# Patient Record
Sex: Female | Born: 1980 | Race: White | Hispanic: No | Marital: Married | Smoking: Never smoker
Health system: Southern US, Community
[De-identification: ages and names within clinical notes are randomized; demographics above are authoritative.]

## PROBLEM LIST (undated history)

## (undated) ENCOUNTER — Inpatient Hospital Stay (HOSPITAL_COMMUNITY): Payer: Self-pay

## (undated) DIAGNOSIS — R112 Nausea with vomiting, unspecified: Secondary | ICD-10-CM

## (undated) DIAGNOSIS — L989 Disorder of the skin and subcutaneous tissue, unspecified: Secondary | ICD-10-CM

## (undated) DIAGNOSIS — Z9889 Other specified postprocedural states: Secondary | ICD-10-CM

## (undated) DIAGNOSIS — Z98891 History of uterine scar from previous surgery: Secondary | ICD-10-CM

## (undated) DIAGNOSIS — Z8751 Personal history of pre-term labor: Secondary | ICD-10-CM

## (undated) DIAGNOSIS — O24410 Gestational diabetes mellitus in pregnancy, diet controlled: Secondary | ICD-10-CM

## (undated) DIAGNOSIS — O24419 Gestational diabetes mellitus in pregnancy, unspecified control: Secondary | ICD-10-CM

## (undated) DIAGNOSIS — O26899 Other specified pregnancy related conditions, unspecified trimester: Secondary | ICD-10-CM

## (undated) DIAGNOSIS — Z6791 Unspecified blood type, Rh negative: Secondary | ICD-10-CM

## (undated) HISTORY — DX: Nausea with vomiting, unspecified: Z98.890

## (undated) HISTORY — DX: Gestational diabetes mellitus in pregnancy, diet controlled: O24.410

## (undated) HISTORY — DX: Personal history of pre-term labor: Z87.51

## (undated) HISTORY — DX: Gestational diabetes mellitus in pregnancy, unspecified control: O24.419

## (undated) HISTORY — DX: Unspecified blood type, rh negative: Z67.91

## (undated) HISTORY — DX: Other specified postprocedural states: R11.2

## (undated) HISTORY — DX: Disorder of the skin and subcutaneous tissue, unspecified: L98.9

## (undated) HISTORY — DX: Other specified pregnancy related conditions, unspecified trimester: O26.899

## (undated) HISTORY — PX: WISDOM TOOTH EXTRACTION: SHX21

## (undated) HISTORY — DX: History of uterine scar from previous surgery: Z98.891

---

## 2006-02-24 ENCOUNTER — Ambulatory Visit: Payer: Self-pay | Admitting: Family Medicine

## 2006-02-24 ENCOUNTER — Encounter: Payer: Self-pay | Admitting: Family Medicine

## 2006-02-25 ENCOUNTER — Ambulatory Visit: Payer: Self-pay | Admitting: Obstetrics & Gynecology

## 2007-02-28 ENCOUNTER — Encounter: Payer: Self-pay | Admitting: Family Medicine

## 2007-02-28 ENCOUNTER — Ambulatory Visit: Payer: Self-pay | Admitting: Family Medicine

## 2007-08-17 ENCOUNTER — Ambulatory Visit: Payer: Self-pay | Admitting: Obstetrics & Gynecology

## 2007-08-24 ENCOUNTER — Inpatient Hospital Stay (HOSPITAL_COMMUNITY): Admission: AD | Admit: 2007-08-24 | Discharge: 2007-08-24 | Payer: Self-pay | Admitting: Obstetrics and Gynecology

## 2007-09-01 ENCOUNTER — Inpatient Hospital Stay (HOSPITAL_COMMUNITY): Admission: AD | Admit: 2007-09-01 | Discharge: 2007-09-01 | Payer: Self-pay | Admitting: Obstetrics and Gynecology

## 2007-09-09 ENCOUNTER — Inpatient Hospital Stay (HOSPITAL_COMMUNITY): Admission: AD | Admit: 2007-09-09 | Discharge: 2007-09-09 | Payer: Self-pay | Admitting: Obstetrics and Gynecology

## 2007-09-10 ENCOUNTER — Inpatient Hospital Stay (HOSPITAL_COMMUNITY): Admission: AD | Admit: 2007-09-10 | Discharge: 2007-09-10 | Payer: Self-pay | Admitting: Family Medicine

## 2007-09-19 ENCOUNTER — Encounter: Payer: Self-pay | Admitting: Obstetrics & Gynecology

## 2007-09-19 ENCOUNTER — Ambulatory Visit: Payer: Self-pay | Admitting: Obstetrics & Gynecology

## 2007-10-17 ENCOUNTER — Ambulatory Visit: Payer: Self-pay | Admitting: Family Medicine

## 2007-11-14 ENCOUNTER — Ambulatory Visit: Payer: Self-pay | Admitting: Gynecology

## 2007-11-28 ENCOUNTER — Ambulatory Visit (HOSPITAL_COMMUNITY): Admission: RE | Admit: 2007-11-28 | Discharge: 2007-11-28 | Payer: Self-pay | Admitting: Gynecology

## 2007-12-12 ENCOUNTER — Ambulatory Visit: Payer: Self-pay | Admitting: Family Medicine

## 2007-12-28 ENCOUNTER — Ambulatory Visit (HOSPITAL_COMMUNITY): Admission: RE | Admit: 2007-12-28 | Discharge: 2007-12-28 | Payer: Self-pay | Admitting: Gynecology

## 2008-01-01 ENCOUNTER — Ambulatory Visit: Payer: Self-pay | Admitting: Gynecology

## 2008-01-16 ENCOUNTER — Ambulatory Visit: Payer: Self-pay | Admitting: Family Medicine

## 2008-01-31 ENCOUNTER — Ambulatory Visit: Payer: Self-pay | Admitting: Gynecology

## 2008-02-01 ENCOUNTER — Ambulatory Visit (HOSPITAL_COMMUNITY): Admission: RE | Admit: 2008-02-01 | Discharge: 2008-02-01 | Payer: Self-pay | Admitting: Gynecology

## 2008-02-06 ENCOUNTER — Ambulatory Visit: Payer: Self-pay | Admitting: Obstetrics & Gynecology

## 2008-02-06 ENCOUNTER — Inpatient Hospital Stay (HOSPITAL_COMMUNITY): Admission: AD | Admit: 2008-02-06 | Discharge: 2008-02-09 | Payer: Self-pay | Admitting: Obstetrics & Gynecology

## 2008-02-06 ENCOUNTER — Encounter: Payer: Self-pay | Admitting: Obstetrics & Gynecology

## 2008-02-10 ENCOUNTER — Encounter: Admission: RE | Admit: 2008-02-10 | Discharge: 2008-03-11 | Payer: Self-pay | Admitting: Obstetrics & Gynecology

## 2008-02-15 ENCOUNTER — Ambulatory Visit: Payer: Self-pay | Admitting: Gynecology

## 2008-03-12 ENCOUNTER — Encounter: Admission: RE | Admit: 2008-03-12 | Discharge: 2008-04-11 | Payer: Self-pay | Admitting: Obstetrics & Gynecology

## 2008-03-14 ENCOUNTER — Ambulatory Visit: Payer: Self-pay | Admitting: Obstetrics & Gynecology

## 2008-04-12 ENCOUNTER — Encounter: Admission: RE | Admit: 2008-04-12 | Discharge: 2008-05-07 | Payer: Self-pay | Admitting: Obstetrics & Gynecology

## 2008-10-23 ENCOUNTER — Ambulatory Visit: Payer: Self-pay | Admitting: Obstetrics & Gynecology

## 2008-10-23 ENCOUNTER — Encounter: Payer: Self-pay | Admitting: Obstetrics & Gynecology

## 2010-03-11 ENCOUNTER — Ambulatory Visit: Payer: Self-pay | Admitting: Obstetrics and Gynecology

## 2010-12-08 NOTE — Assessment & Plan Note (Signed)
NAME:  Alexandra Douglas, Alexandra Douglas NO.:  [id number]   MEDICAL RECORD NO.:  [id number]          PATIENT TYPE:  POB   LOCATION:  [facility] at [facility]         FACILITY:  [facility]   PHYSICIAN:  Ginger Carne, MD DATE OF BIRTH:  March 06, 1981   DATE OF SERVICE:  02/15/2008                                  CLINIC NOTE   [name] returns today for followup after a primary low-transverse  cesarean section for preterm delivery of twins on February 06, 2008.  Her  incision had a subcuticular stitch placed and is well healed, dry, and  clean without evidence of cellulitis.  The patient has information about  an IUD and birth control options.  Her bleeding is scant to light.  The  patient is breast-feeding and she reports the babies are doing well.  She will return in 3-4 weeks for her postpartum visit.           ______________________________  Ginger Carne, MD     SHB/MEDQ  D:  02/15/2008  T:  02/16/2008  Job:  [id number]

## 2010-12-08 NOTE — Op Note (Signed)
NAME:  Alexandra Douglas, Alexandra Douglas NO.:  [id number]   MEDICAL RECORD NO.:  [id number]          PATIENT TYPE:  INP   LOCATION:                                FACILITY:  [facility]   PHYSICIAN:  Alexandra Bossier, MD        DATE OF BIRTH:  03-Sep-1980   DATE OF PROCEDURE:  02/06/2008  DATE OF DISCHARGE:  02/09/2008                               OPERATIVE REPORT   PREOPERATIVE DIAGNOSES:  1. Intrauterine pregnancy at 51 weeks' and 0 days' dichorionic      diamniotic twin gestation.  2. Labor.  3. Preterm premature rupture of membranes.  4. Group B Streptococcus, unknown.   POSTOPERATIVE DIAGNOSES:  1. Intrauterine pregnancy at 34 weeks' and 0 days' dichorionic      diamniotic twin gestation.  2. Labor.  3. Preterm premature rupture of membranes.  4. Group B Streptococcus, unknown.   PROCEDURE:  Primary low-transverse cesarean section.   SURGEON:  Alexandra Bossier, MD   ASSISTANT:  Alexandra Lemon, MD   ANESTHESIA:  Spinal.   FINDINGS:  1. Baby A is a viable infant female, weight pending with Apgars 7 at 1      minute and 9 at 5 minutes, in vertex presentation.  2. Baby B is a viable infant female, weight pending at time of      dictation, Apgars 6 at 1 minute and 9 at 5 minutes, in frank breech      presentation.  3. Clear amniotic fluid from gestational sac of twin B.  4. Normal female pelvic anatomy.   ESTIMATED BLOOD LOSS:  800 mL.   DRAINS:  Foley with clear yellow urine.   COMPLICATIONS:  None immediate.   SPECIMENS:  1. Placenta for twin A and B to pathology.  2. Cord blood for A and B to the lab.   INDICATIONS FOR PROCEDURE:  Alexandra Douglas is a 30 year old gravida 1,  para 0, at 22 weeks' and 0 days' gestation presenting with dichorionic  diamniotic twins.  She presented to [facility] with rupture of  membranes of twin A.  At time of admission, her cervix was closed  visually on speculum examination.  She was admitted and started on  magnesium sulfate for 12 hours  for cerebral palsy risk reduction,  betamethasone for fetal lung maturity, and ampicillin and erythromycin  for latency after rupture of membranes.  She remained stable most of  hospital day #1 but around 7:30 p.m. on January 07, 2008, the patient  started having painful uterine contractions.  She was reevaluated with  speculum examination, and cervix was visualized to be 4 cm open with  twin A's head visualized.  At this time, the patient was taken for  primary low-transverse cesarean section.   DESCRIPTION OF PROCEDURE:  The patient was taken to the operating room  and after obtaining adequate spinal anesthesia was prepped and draped in  the usual sterile manner in the supine position with left lateral  uterine displacement.  After ensuring adequate anesthesia, Pfannenstiel  skin incision was made using the scalpel.  Incision was carried down  through the subcutaneous tissues using the scalpel.  The rectus fascia  was nicked in the midline and the incision was extended laterally in  each direction using the Mayo scissors.  The rectus muscles were  dissected free of the fascia using both sharp and blunt dissection.  Rectus muscles were then separated bluntly.  Parietal peritoneum was  identified, grasped between the hemostats, elevated, and entered under  direct visualization.  Bladder blade was placed, and the lower uterine  segment was evaluated and found to be of adequate size for the low-  transverse uterine incision.  A low-transverse uterine incision was made  using the scalpel, and the incision was extended laterally and  superiorly using bandage scissors.  Hand was placed within the uterine  cavity and used to elevate and flex the head of infant A, which was  delivered without difficulty.  The infant was bulb suctioned after the  delivery.  The cord was doubly clamped and cut.  The infant was handed  to the nursery team in attendance.  Attention was then turned to twin B.  The  membranes were ruptured with clear amniotic fluid.  The infant was  found to be in frank breech presentation.  Hand was placed around the  infant's buttocks and used to elevate the buttocks of the infant.  The  infant was then delivered in the usual manner without difficulty.  The  cord was doubly clamped and cut and the infant was handed to nursery  team in attendance.  The infant was bulb suctioned after delivery as  well.  Specimens were collected from each cord for cord blood.  The  placentas were then delivered manually and appeared intact.  The  endometrial cavity was then wiped free of any trace of membranes using  wet laparotomy sponges.  The uterine incision was then closed in 1 layer  with a running locking stitch of 0 chromic.  One small area of bleeding  was controlled with 1 suture of 0 chromic in a figure-of-eight fashion.  Uterine incision was inspected and found to have adequate hemostasis.  At this time, the attention was turned to the tubes and ovaries, which  were palpated and felt normal.  The rectus muscles were then inspected  and found to be hemostatic.  The rectus fascia was then reapproximated  with 1 suture of 0 Vicryl in a running unlocked fashion.  The small  areas of bleeding and subcutaneous tissue were controlled using the  Bovie cautery.  The skin was reapproximated with 1 subcuticular stitch  of 3-0 Vicryl.  Sponge, needle, and instrument counts were correct x3.  The patient tolerated the procedure well and went to the postanesthesia  care unit in stable condition.       Alexandra Lemon, MD  Electronically Signed     ______________________________  Alexandra Bossier, MD    NS/MEDQ  D:  02/07/2008  T:  02/08/2008  Job:  [id number]

## 2010-12-08 NOTE — Assessment & Plan Note (Signed)
NAME:  Alexandra Douglas, Alexandra Douglas NO.:  [id number]   MEDICAL RECORD NO.:  [id number]          PATIENT TYPE:  POB   LOCATION:  [facility] at [facility]         FACILITY:  [facility]   PHYSICIAN:  Catalina Antigua, MD     DATE OF BIRTH:  06-Mar-1981   DATE OF SERVICE:  03/11/2010                                  CLINIC NOTE   This is a 30 year old G1, P 0-1-0-2 with LMP of February 27, 2010, who  presents today for annual exam.  The patient is currently without any  complaints.  Denies any abnormal bleeding or discharge.  The patient is  using Zeosa for birth control and is currently happy with her current  method.   PAST MEDICAL HISTORY:  She denies.   PAST SURGICAL HISTORY:  She has had a C-section.   PAST OBSTETRICAL HISTORY:  She has had C-section in 2009 secondary to  twin pregnancy.   PAST GYNECOLOGIC HISTORY:  She denies any cyst or fibroids or history of  abnormal Pap smears.   SOCIAL HISTORY:  She denies drinking, smoking, or the use of illicit  drugs.   FAMILY HISTORY:  Significant for heart disease in her grandparents,  otherwise is noncontributory.   REVIEW OF SYSTEMS:  Otherwise within normal limits.   MEDICATIONS:  Zeosa.   ALLERGIES:  She denies any drug allergies.   PHYSICAL EXAMINATION:  VITAL SIGNS:  Her blood pressure is 93/67, pulse  of 74, weight of 149 pounds, height of 5 feet 4 inches.  LUNGS:  Clear to auscultation bilaterally.  HEART:  Regular rate and rhythm.  BREASTS:  Nontender, equal in size.  No skin dimpling.  No expressible  nipple discharge.  No palpable masses or lymphadenopathy.  ABDOMEN:  Soft, nontender, nondistended.  PELVIC:  She had normal-appearing external genitalia, normal-appearing  vaginal mucosa and cervix.  No abnormal bleeding or discharge.  She has  small retroverted uterus.  No palpable adnexal masses or tenderness.   ASSESSMENT AND PLAN:  This is a 30 year old G1, P 0-1-0-2 who presents  today for annual exam.  Pap smear was  performed.  The patient is not  interested in STD testing as she is in a monogamous relationship.  The  patient will be contacted with any abnormal results.  The patient will  return in a year or p.r.n.           ______________________________  Catalina Antigua, MD     PC/MEDQ  D:  03/11/2010  T:  03/11/2010  Job:  [id number]

## 2010-12-08 NOTE — Assessment & Plan Note (Signed)
NAME:  Alexandra Douglas, Alexandra Douglas NO.:  [id number]   MEDICAL RECORD NO.:  [id number]          PATIENT TYPE:  POB   LOCATION:  [facility]         FACILITY:  [facility]   PHYSICIAN:  Tinnie Gens, MD        DATE OF BIRTH:  03/12/1981   DATE OF SERVICE:  02/28/2007                                  CLINIC NOTE   Alexandra Douglas is a 30 year old female who presents today for her annual  exam and Pap smear.  She reports that she has been married for two  months and was not sexually active until she was married.  The same for  her husband.  She is a Education officer, museum.  She denies any complaints  today.  She says she is here to renew her birth control pills.   PAST MEDICAL HISTORY:  Negative.   PAST SURGICAL HISTORY:  Negative.   ALLERGIES:  No known allergies.   MEDICATIONS:  Femcon 135/28.  She is happy with this oral contraceptive.   FAMILY HISTORY:  Significant for coronary artery disease in her  grandparents.   SOCIAL HISTORY:  She is married.  Denies alcohol, tobacco, or drug use.   REVIEW OF SYSTEMS:  A 14-point review is negative.   PHYSICAL EXAMINATION:  VITAL SIGNS:  Pulse 69, blood pressure 111/65.  Weight is 144.  Height is 5 feet 4.  The first day of her last menstrual  period was February 17, 2007.  HEENT:  Without thyroid enlargement.  BREASTS:  Bilaterally clear without masses, nodes, or nipple discharge.  CHEST:  Clear to A&P.  HEART:  Rate and rhythm is regular without murmur or cardiac  enlargement.  ABDOMEN:  Soft and scaphoid without masses or organomegaly.  PELVIC:  External genitalia within normal limits for female.  Vagina is  clean and rugose.  Cervix is nulliparous and clean.  The uterus is  normal shape in size and contour.  Adnexa are bilaterally clear.  Rectovaginal deferred.  EXTREMITIES:  Within normal limits.   IMPRESSION:  1. Normal gynecological examination.  2. Oral contraceptives.   PLAN:  1. Pap results will be back in 1-2  weeks.  We will notify the patient      of followup.  2. Femcon FE #1 pack with p.r.n. refills.  She is to take 1 daily.  3. She states that she and her husband may consider a family in a      year.  Patient instructed to have two normal      menstrual cycles after going off the pill to try to conceive as      well as to begin prenatal vitamins at the time she discontinues her      Femcon.     ______________________________  Matt Holmes, N.P.    ______________________________  Tinnie Gens, MD    EMK/MEDQ  D:  02/28/2007  T:  03/01/2007  Job:  [id number]

## 2010-12-08 NOTE — Assessment & Plan Note (Signed)
NAME:  Alexandra Douglas, Alexandra Douglas NO.:  [id number]   MEDICAL RECORD NO.:  [id number]          PATIENT TYPE:  POB   LOCATION:  [facility]         FACILITY:  [facility]   PHYSICIAN:  Johnella Moloney, MD        DATE OF BIRTH:  26-Mar-1981   DATE OF SERVICE:  10/23/2008                                  CLINIC NOTE   The patient is a 30 year old gravida 1, para 0-1-0-2 with last menstrual  period of October 13, 2008, who is here for annual exam.  The patient was  followed by [place] for her last pregnancy.  She had a twin gestation and had  PPROM and cesarean section at 28-6/7 weeks due to the PPROM and preterm  labor.  The patient reports that her twin girls are doing well, their  names are Addison and Alyse, they are about 86 months of age and have no  problems.  She is currently bottle feeding.  She is in high spirits and  has no other postpartum concerns.  She has a lot of help from her mother-  in-law and her husband, and she is sexually active without any problems.  She has been on Femcon Fe for birth control and wants a refill of this  medication.  She reports no other gynecologic concerns.   PAST MEDICAL HISTORY:  None.   PAST SURGICAL HISTORY:  Cesarean section.   PAST OBSTETRIC/GYNECOLOGIC HISTORY:  Gravida 1, para 0-1-0-2.  She had  twin girls on February 06, 2008.  She has normal menstrual periods.  Her  last one was in October 13, 2008.  No intermenstrual bleeding abnormal  discharge.  Denies any sexually transmitted infections, has no problems  during sexual intercourse.  Her last Pap smear was on September 19, 2007,  and was normal.  She denies any history of cervical dysplasia.   MEDICATIONS:  Femcon Fe.   ALLERGIES:  No known drug allergies.   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  She lives with her husband  and her daughters.  She denies any past or current history of sexual,  physical, or  emotional abuse.  She does not drink any alcohol, smoke any  cigarettes, or use any tobacco in any form and denies any illicit or  intravenous drug use.   Familial history is negative for any gynecologic cancers or other  cancers.  It is remarkable for heart disease in her grandparents.   Review of systems is entirely negative.  Of note, a 14-point  comprehensive review of systems was reviewed.  The patient does report  exercising (walking) 3-4 times a week.  She does not take a multivitamin  and this was encouraged for her to do for general preventative health  maintenance.   PHYSICAL EXAMINATION:  VITAL SIGNS:  Blood pressure 103/68, weight 171  pounds, height 5 feet 4 inches, pulse 65.  GENERAL:  No apparent distress.  HEENT:  Normocephalic, atraumatic.  NECK:  Normal neck, normal thyroid.  No abnormal masses palpated.  BREASTS:  Symmetric in size, nontender.  No abnormal masses, skin  changes, drainage, or  lymphadenopathy noted.  CHEST:  Clear to auscultation bilaterally.  HEART:  Regular rate and rhythm.  ABDOMEN:  Soft, nontender, nondistended.  Well-healed Pfannenstiel  incision.  EXTREMITIES:  No cyanosis, clubbing, or edema.  Nontender.  PELVIC:  Normal external female genitalia.  Pink, well-rugated vagina,  normal discharge.  Normal cervical contour.  No abnormal drainage,  nontender.  Pap smear sample was obtained.  No tenderness on palpation  of the uterus or adnexa.  Uterus is normal size, then retroverted,  mobile.   ASSESSMENT AND PLAN:  The patient is a 30 year old gravida 1, para 0-1-0-  2 here for her annual examination.  She had a Pap smear that is done  today and a normal breast examination.  We will follow up on the results  of Pap smear.  The patient was also given a refill for her Femcon Fe.  She was told to return to the Gynecologic Clinic in 1 year for her  annual or earlier for any gynecologic concerns.           ______________________________   Johnella Moloney, MD     UD/MEDQ  D:  10/23/2008  T:  10/23/2008  Job:  [id number]

## 2010-12-08 NOTE — Discharge Summary (Signed)
NAME:  Alexandra Douglas, Alexandra Douglas NO.:  [id number]   MEDICAL RECORD NO.:  [id number]          PATIENT TYPE:  INP   LOCATION:                                FACILITY:  [facility]   PHYSICIAN:  Ginger Carne, MD  DATE OF BIRTH:  16-Aug-1980   DATE OF ADMISSION:  02/06/2008  DATE OF DISCHARGE:  02/09/2008                               DISCHARGE SUMMARY   ADMISSION DIAGNOSES:  1. Intrauterine pregnancy at 28 weeks 6 days gestation.  2. Preterm premature rupture of membranes.  3. Group B Streptococcus unknown.  4. Rh negative.   POSTOPERATIVE DIAGNOSES:  1. Postoperative day #3, primary low-transverse cesarean section of      twin female infants.  2. Group B Streptococcus unknown.  3. Rh negative.   PROCEDURES:  The patient had a primary low-transverse cesarean section  performed by Dr. Nicholaus Bloom on February 06, 2008 of viable twin female  gestation.   COMPLICATIONS:  None.   CONSULTATIONS:  None.   PERTINENT LABORATORY FINDINGS:  Upon admission, Alexandra Douglas had a  complete blood count showing white blood cells of 10.4, hemoglobin of  12.5, hematocrit 35.3, and platelets 253.  Wet prep was negative, HIV  was nonreactive, hepatitis B surface antigen was negative, rubella was  immune, RPR was nonreactive, type and screen was AB negative with a  positive passively acquired anti-D, GC chlamydia were negative, and  group B Streptococcus was negative.  On postoperative day #2, complete  blood count showed white blood cells 9.0, hemoglobin 10.5, hematocrit  30.4, and platelets 199.   BRIEF PERTINENT ADMISSION HISTORY:  Alexandra Douglas is a 30 year old  gravida 1, para 0, presenting at 28 weeks 5 days gestation with rupture  of membranes at 1 a.m. on February 06, 2008.  The patient was admitted for  preterm premature rupture of membranes.   HOSPITAL COURSE:  The patient was admitted to the antenatal unit and  betamethasone was started for fetal lung maturity.  The patient also  received magnesium sulfate to decrease the risk of cerebral palsy in the  infants.  The patient was stable throughout the day, but did start to  have painful contractions in the early evening of day of admission.  She  was found to be dilated on speculum examination to 4 cm.  At this time,  she was taken for primary low-transverse cesarean section after rupture  of membranes and onset of labor.  She did deliver a viable twin females  via primary low-transverse cesarean section on February 06, 2008.  She  tolerated the procedure well and the infants admitted to the neonatal  intensive care unit.  The patient had an uncomplicated postop course and  did well throughout her stay.  Her hemoglobin did drop to a level of  10.5.  Her physical examination was normal at the time of discharge.  Her blood type was AB negative and it was noted that at least one infant  was Rh positive.  She will get RhoGAM prior to discharge.   DISCHARGE STATUS:  Stable.   DISCHARGE MEDICATIONS:  1. Continue prenatal vitamins  1 tablet daily.  2. Percocet 5/325 1-2 tablets every 4-6 hours as needed for pain.  3. Motrin 600 mg 1 tablet every 6 hours as needed for pain.  4. Colace 100 mg 1 tablet twice daily.  5. Ferrous sulfate 325 mg 1 tablet by mouth twice daily for 30 days.   DISCHARGE INSTRUCTIONS:  1. Discharged to home.  2. Regular diet.  3. No sexual activity x6 weeks.  4. No lifting greater than 10 pounds x6 weeks.  5. The patient is to follow up at [facility] in 1 week for incision      check and in 6 weeks for postpartum examination.      Alexandra Lemon, MD  Electronically Signed     ______________________________  Ginger Carne, MD    NS/MEDQ  D:  02/09/2008  T:  02/09/2008  Job:  [id number]

## 2010-12-08 NOTE — Assessment & Plan Note (Signed)
NAME:  LINEA, CALLES NO.:  [id number]   MEDICAL RECORD NO.:  [id number]          PATIENT TYPE:  POB   LOCATION:  [facility]         FACILITY:  [facility]   PHYSICIAN:  Elsie Lincoln, MD      DATE OF BIRTH:  08/16/1980   DATE OF SERVICE:  03/14/2008                                  CLINIC NOTE   The patient is a 30 year old female who is here for her postpartum  check.  She had triplets that spontaneously reduced to twins and she had  PPROM and had a C-section on February 06, 2008.  The patient did well  postoperatively and postpartum.  She had some drainage from the right  side of her incision, but this has since resolved.  The twins are still  in the NICU, but they are not intubated and they are feeders and  growers.  The patient denies any depression.  She is breast-feeding and  has no symptoms of mastitis.   PAST MEDICAL HISTORY:  None.  No problems are addressed today.   HEALTHCARE MAINTENANCE:  Pap smear due in February 2010.   BIRTH CONTROL:  The patient is opting for condoms.  She understands they  are 85% effective, and she states she is still deciding about what to  do.  She will call us when she has found something different.   PHYSICAL EXAMINATION:  VITAL SIGNS:  Pulse 76, blood pressure 86/59,  weight 175, and height 5 feet 4 inches.  GENERAL:  Well nourished, well developed, in no apparent distress.  BREASTS:  Refused.  ABDOMEN:  Soft, nontender, and nondistended.  Incision well healed.  GENITALIA:  Tanner V.  Vagina pink.  Normal rugae.  Cervix, closed and  nontender.  Uterus and adnexa, no masses and nontender.  Perineum was  slightly irritated.  The patient wear pads every day.  The patient  encouraged to stop to see if it helps.   ASSESSMENT AND PLAN:  A 30 year old female status post cesarean section  for preterm premature rupture of membranes with twins at 76 weeks.   1. Pap smear due in February.  2. Stop using sanitary pads daily.  3. Come back to [place] if she changes her mind on birth control.           ______________________________  Elsie Lincoln, MD     KL/MEDQ  D:  03/14/2008  T:  03/15/2008  Job:  [id number]

## 2010-12-11 NOTE — Group Therapy Note (Signed)
NAME:  Alexandra Douglas, Alexandra Douglas NO.:  [id number]   MEDICAL RECORD NO.:  [id number]          PATIENT TYPE:  POB   LOCATION:  [facility]                   FACILITY:  [facility]   PHYSICIAN:  Tinnie Gens, MD        DATE OF BIRTH:  1981/05/04   DATE OF SERVICE:  02/24/2006                                    CLINIC NOTE   [place]:   CHIEF COMPLAINT:  Physical examination and Pap smear.   HISTORY OF PRESENT ILLNESS:  The patient is a 30 year old nulligravida who  is not sexually active and has never been who works as a Runner, broadcasting/film/video in [employer].  She teachers eighth grade science.  The patient has had a previous Pap smear  in July of 2005.  She currently has a boyfriend, is looking to get married  in the spring, and was wondering about getting on birth control pills a  month or two prior to her wedding.  Patient reports she has regular cycles  and no problems with her cycles.   PAST MEDICAL HISTORY:  Negative.   PAST SURGICAL HISTORY:  Negative.   ALLERGIES:  None known.   MEDICATIONS:  None.   FAMILY HISTORY:  Coronary artery disease in her grandparents.   SOCIAL HISTORY:  No tobacco, alcohol, or drug use.   REVIEW OF SYSTEMS:  14-point review of systems reviewed.  Please see GYN  history in the chart, but is negative.   PHYSICAL EXAMINATION:  VITAL SIGNS:  As noted in the chart.  GENERAL:  She is a well-developed, well-nourished female in no acute  distress.  LUNGS:  Clear bilaterally.  CV:  Regular rate and rhythm without murmurs, rubs, or gallops.  NECK:  Supple with normal thyroid.  ABDOMEN:  Soft, nontender, nondistended.  BREASTS:  Symmetric with everted nipples.  No supraclavicular or axillary  adenopathy.  GU:  Normal external female genitalia.  Vagina is pink and rugated.  The  cervix is nulliparous and without lesion.  The uterus is small, anteverted.  Adnexa are without mass or tenderness.  BUS is normal.  EXTREMITIES:  No clubbing, cyanosis, edema.   IMPRESSION:  GYN examination with Pap in a patient who has never been  sexually active.   PLAN:  1.  Pap smear today.  2.  I have given her a prescription of Ovcon 1/35 Fe to start once she is      sure about getting married.  3.  The patient is additionally asked to come back yearly for Pap smears      once she becomes sexually active for a few years.           ______________________________  Tinnie Gens, MD     TP/MEDQ  D:  02/24/2006  T:  02/24/2006  Job:  [id number]

## 2011-04-05 ENCOUNTER — Ambulatory Visit (INDEPENDENT_AMBULATORY_CARE_PROVIDER_SITE_OTHER): Payer: BC Managed Care – PPO | Admitting: Obstetrics and Gynecology

## 2011-04-05 ENCOUNTER — Encounter: Payer: Self-pay | Admitting: Obstetrics and Gynecology

## 2011-04-05 ENCOUNTER — Other Ambulatory Visit: Payer: Self-pay | Admitting: Obstetrics and Gynecology

## 2011-04-05 ENCOUNTER — Ambulatory Visit: Payer: Self-pay | Admitting: Obstetrics and Gynecology

## 2011-04-05 VITALS — BP 113/76 | HR 75 | Ht 63.0 in | Wt 173.0 lb

## 2011-04-05 DIAGNOSIS — B3731 Acute candidiasis of vulva and vagina: Secondary | ICD-10-CM

## 2011-04-05 DIAGNOSIS — B373 Candidiasis of vulva and vagina: Secondary | ICD-10-CM

## 2011-04-05 MED ORDER — FLUCONAZOLE 100 MG PO TABS: 100.0000 mg | ORAL_TABLET | Freq: Every day | ORAL | Status: AC

## 2011-04-05 MED ORDER — NYSTATIN-TRIAMCINOLONE 100000-0.1 UNIT/GM-% EX OINT: TOPICAL_OINTMENT | Freq: Two times a day (BID) | CUTANEOUS | Status: DC

## 2011-04-05 NOTE — Progress Notes (Signed)
Addended by: Kristen Loader D on: 04/05/2011 03:29 PM   Modules accepted: Orders

## 2011-04-05 NOTE — Progress Notes (Signed)
Patient is a 30 year old gravida 1 para 010 to. Since her last. She's had some vulvar swelling and itching without much discharge on examination she is some erythema around the vulva vagina is clean well rugated with a small bit of creamy discharge wet prep was taken  Plan: We'll order some Diflucan and Mycolog for this patient pending the wet prep reported as she is quite uncomfortable.

## 2011-04-06 LAB — WET PREP, GENITAL
Clue Cells Wet Prep HPF POC: NONE SEEN
Trich, Wet Prep: NONE SEEN
WBC, Wet Prep HPF POC: NONE SEEN

## 2011-04-15 LAB — URINALYSIS, ROUTINE W REFLEX MICROSCOPIC
Bilirubin Urine: NEGATIVE
Ketones, ur: 15 — AB
Nitrite: NEGATIVE
Protein, ur: NEGATIVE
Urobilinogen, UA: 0.2

## 2011-04-15 LAB — RH IMMUNE GLOBULIN WORKUP (NOT WOMEN'S HOSP)
ABO/RH(D): AB NEG
Antibody Screen: NEGATIVE

## 2011-04-15 LAB — CBC
HCT: 38.9
Hemoglobin: 13.6
MCHC: 34.8
MCV: 89.7
RBC: 4.34
WBC: 7.3

## 2011-04-15 LAB — WET PREP, GENITAL

## 2011-04-15 LAB — GC/CHLAMYDIA PROBE AMP, GENITAL: Chlamydia, DNA Probe: NEGATIVE

## 2011-04-15 LAB — HCG, QUANTITATIVE, PREGNANCY

## 2011-04-16 LAB — URINALYSIS, ROUTINE W REFLEX MICROSCOPIC
Bilirubin Urine: NEGATIVE
Glucose, UA: NEGATIVE
Ketones, ur: NEGATIVE
Protein, ur: NEGATIVE
pH: 7

## 2011-04-16 LAB — URINE MICROSCOPIC-ADD ON

## 2011-04-22 LAB — TYPE AND SCREEN
ABO/RH(D): AB NEG
Antibody Screen: POSITIVE
DAT, IgG: NEGATIVE

## 2011-04-22 LAB — RUBELLA SCREEN: Rubella: 294.8 — ABNORMAL HIGH

## 2011-04-22 LAB — GC/CHLAMYDIA PROBE AMP, GENITAL
Chlamydia, DNA Probe: NEGATIVE
GC Probe Amp, Genital: NEGATIVE

## 2011-04-22 LAB — RPR: RPR Ser Ql: NONREACTIVE

## 2011-04-22 LAB — CBC
Hemoglobin: 12.5
MCHC: 35.5
MCV: 92.2
RDW: 12.4

## 2011-04-22 LAB — STREP B DNA PROBE

## 2011-04-22 LAB — WET PREP, GENITAL
Clue Cells Wet Prep HPF POC: NONE SEEN
Trich, Wet Prep: NONE SEEN

## 2011-04-22 LAB — HEPATITIS B SURFACE ANTIGEN: Hepatitis B Surface Ag: NEGATIVE

## 2011-04-23 LAB — CBC
HCT: 30.4 — ABNORMAL LOW
Hemoglobin: 10.1 — ABNORMAL LOW
Hemoglobin: 10.5 — ABNORMAL LOW
MCHC: 34.6
MCHC: 35.3
MCV: 93.3
RBC: 3.07 — ABNORMAL LOW
RBC: 3.26 — ABNORMAL LOW
WBC: 13.6 — ABNORMAL HIGH
WBC: 9

## 2011-04-23 LAB — RH IMMUNE GLOB WKUP(>/=20WKS)(NOT WOMEN'S HOSP)

## 2011-05-30 ENCOUNTER — Ambulatory Visit: Payer: Self-pay | Admitting: Internal Medicine

## 2011-06-02 ENCOUNTER — Encounter: Payer: Self-pay | Admitting: *Deleted

## 2011-06-02 ENCOUNTER — Ambulatory Visit (INDEPENDENT_AMBULATORY_CARE_PROVIDER_SITE_OTHER): Payer: BC Managed Care – PPO | Admitting: Gynecology

## 2011-06-02 DIAGNOSIS — N912 Amenorrhea, unspecified: Secondary | ICD-10-CM

## 2011-06-02 DIAGNOSIS — O3680X0 Pregnancy with inconclusive fetal viability, not applicable or unspecified: Secondary | ICD-10-CM

## 2011-06-02 DIAGNOSIS — Z3480 Encounter for supervision of other normal pregnancy, unspecified trimester: Secondary | ICD-10-CM

## 2011-06-03 LAB — OBSTETRIC PANEL
Antibody Screen: NEGATIVE
Basophils Absolute: 0 10*3/uL (ref 0.0–0.1)
Eosinophils Absolute: 0.2 10*3/uL (ref 0.0–0.7)
Eosinophils Relative: 2 % (ref 0–5)
HCT: 38.8 % (ref 36.0–46.0)
Lymphocytes Relative: 42 % (ref 12–46)
MCH: 30.9 pg (ref 26.0–34.0)
MCHC: 34.3 g/dL (ref 30.0–36.0)
MCV: 90.2 fL (ref 78.0–100.0)
Monocytes Absolute: 0.6 10*3/uL (ref 0.1–1.0)
Platelets: 266 10*3/uL (ref 150–400)
RDW: 12.4 % (ref 11.5–15.5)
Rubella: 262.9 IU/mL — ABNORMAL HIGH
WBC: 7.3 10*3/uL (ref 4.0–10.5)

## 2011-06-03 LAB — HIV ANTIBODY (ROUTINE TESTING W REFLEX): HIV: NONREACTIVE

## 2011-06-03 LAB — HEPATITIS C ANTIBODY: HCV Ab: NEGATIVE

## 2011-06-04 LAB — URINE CULTURE: Colony Count: NO GROWTH

## 2011-06-07 ENCOUNTER — Ambulatory Visit (HOSPITAL_COMMUNITY): Payer: Self-pay

## 2011-06-09 ENCOUNTER — Ambulatory Visit (HOSPITAL_COMMUNITY)
Admission: RE | Admit: 2011-06-09 | Discharge: 2011-06-09 | Disposition: A | Discharge status: Home / Self Care | Payer: BC Managed Care – PPO | Source: Ambulatory Visit | Attending: Obstetrics & Gynecology | Admitting: Obstetrics & Gynecology

## 2011-06-09 DIAGNOSIS — O34219 Maternal care for unspecified type scar from previous cesarean delivery: Secondary | ICD-10-CM | POA: Insufficient documentation

## 2011-06-09 DIAGNOSIS — O3680X0 Pregnancy with inconclusive fetal viability, not applicable or unspecified: Secondary | ICD-10-CM

## 2011-06-09 DIAGNOSIS — O368390 Maternal care for abnormalities of the fetal heart rate or rhythm, unspecified trimester, not applicable or unspecified: Secondary | ICD-10-CM | POA: Insufficient documentation

## 2011-06-16 ENCOUNTER — Ambulatory Visit (INDEPENDENT_AMBULATORY_CARE_PROVIDER_SITE_OTHER): Payer: BC Managed Care – PPO | Admitting: Obstetrics & Gynecology

## 2011-06-16 ENCOUNTER — Encounter: Payer: Self-pay | Admitting: Obstetrics & Gynecology

## 2011-06-16 DIAGNOSIS — Z8751 Personal history of pre-term labor: Secondary | ICD-10-CM

## 2011-06-16 DIAGNOSIS — Z1272 Encounter for screening for malignant neoplasm of vagina: Secondary | ICD-10-CM

## 2011-06-16 DIAGNOSIS — O0990 Supervision of high risk pregnancy, unspecified, unspecified trimester: Secondary | ICD-10-CM | POA: Insufficient documentation

## 2011-06-16 DIAGNOSIS — Z113 Encounter for screening for infections with a predominantly sexual mode of transmission: Secondary | ICD-10-CM

## 2011-06-16 DIAGNOSIS — O09219 Supervision of pregnancy with history of pre-term labor, unspecified trimester: Secondary | ICD-10-CM

## 2011-06-16 HISTORY — DX: Personal history of pre-term labor: Z87.51

## 2011-06-16 NOTE — Progress Notes (Signed)
Addended by: ANYANWU, UGONNA A on: 06/16/2011 11:46 AM   Modules accepted: Orders  

## 2011-06-16 NOTE — Progress Notes (Signed)
   Subjective:    Alexandra Douglas is a [id number] [redacted]w[redacted]d being seen today for her first obstetrical visit.  Her obstetrical history is significant for preterm delivery at 29 weeks for twins; had PPROM and then a LTCS. Patient does intend to breast feed. Pregnancy history fully reviewed.  Patient reports no complaints.  Filed Vitals:   06/16/11 1000  BP: 113/66  Weight: 173 lb (78.472 kg)    HISTORY: OB History    Grav Para Term Preterm Abortions TAB SAB Ect Mult Living   2 1  1     1 2      # Outc Date GA Lbr Len/2nd Wgt Sex Del Anes PTL Lv   1A PRE 7/09 [redacted]w[redacted]d  2lb6oz(1.077kg) F LTCS Spinal Yes Yes   Comments: TWINS   1B  7/09 [redacted]w[redacted]d  2lb4oz(1.021kg)  LTCS Spinal Yes    2 CUR              Past Medical History  Diagnosis Date  . PONV (postoperative nausea and vomiting)   . Preterm labor    Past Surgical History  Procedure Date  . Cesarean section 02/06/2008  . Wisdom tooth extraction    Family History  Problem Relation Age of Onset  . Arthritis Mother   . Hyperlipidemia Mother   . Heart disease Maternal Grandfather      Exam    Uterine Size: size equals dates  Pelvic Exam:    Perineum: No Hemorrhoids   Vulva: normal   Vagina:  normal mucosa, normal discharge   pH: Not done   Cervix: no lesions and normal   Adnexa: normal adnexa   Bony Pelvis: average  System: Breast:  normal appearance, no masses or tenderness   Skin: normal coloration and turgor, no rashes    Neurologic: oriented, normal   Extremities: normal strength, tone, and muscle mass   HEENT PERRLA   Mouth/Teeth mucous membranes moist, pharynx normal without lesions and dental hygiene good   Neck supple and no masses   Cardiovascular: regular rate and rhythm   Respiratory:  appears well, vitals normal, no respiratory distress, acyanotic, normal RR, neck free of mass or lymphadenopathy, chest clear, no wheezing, crepitations, rhonchi, normal symmetric air entry   Abdomen: soft, non-tender; bowel sounds  normal; no masses,  no organomegaly   Urinary: urethral meatus normal      Assessment:    Pregnancy: [id number] Patient Active Problem List  Diagnoses  . History of preterm delivery, currently pregnant  . Pregnancy, supervision for, high-risk        Plan:     Initial labs drawn. Prenatal vitamins. Problem list reviewed and updated. Genetic Screening discussed First Screen, Integrated Screen and Quad Screen: declined.  Ultrasound discussed; fetal survey: will be ordered at 18-20 weeks. Counseled about 17P, patient agrees, will start at 16 weeks  Follow up in 4 weeks.   Fard Borunda A 06/16/2011

## 2011-06-16 NOTE — Patient Instructions (Signed)
Pregnancy - First Trimester During sexual intercourse, millions of sperm go into the vagina. Only 1 sperm will penetrate and fertilize the female egg while it is in the Fallopian tube. One week later, the fertilized egg implants into the wall of the uterus. An embryo begins to develop into a baby. At 6 to 8 weeks, the eyes and face are formed and the heartbeat can be seen on ultrasound. At the end of 12 weeks (first trimester), all the baby's organs are formed. Now that you are pregnant, you will want to do everything you can to have a healthy baby. Two of the most important things are to get good prenatal care and follow your caregiver's instructions. Prenatal care is all the medical care you receive before the baby's birth. It is given to prevent, find, and treat problems during the pregnancy and childbirth. PRENATAL EXAMS  During prenatal visits, your weight, blood pressure and urine are checked. This is done to make sure you are healthy and progressing normally during the pregnancy.   A pregnant woman should gain 25 to 35 pounds during the pregnancy. However, if you are over weight or underweight, your caregiver will advise you regarding your weight.   Your caregiver will ask and answer questions for you.   Blood work, cervical cultures, other necessary tests and a Pap test are done during your prenatal exams. These tests are done to check on your health and the probable health of your baby. Tests are strongly recommended and done for HIV with your permission. This is the virus that causes AIDS. These tests are done because medications can be given to help prevent your baby from being born with this infection should you have been infected without knowing it. Blood work is also used to find out your blood type, previous infections and follow your blood levels (hemoglobin).   Low hemoglobin (anemia) is common during pregnancy. Iron and vitamins are given to help prevent this. Later in the pregnancy,  blood tests for diabetes will be done along with any other tests if any problems develop. You may need tests to make sure you and the baby are doing well.   You may need other tests to make sure you and the baby are doing well.  CHANGES DURING THE FIRST TRIMESTER (THE FIRST 3 MONTHS OF PREGNANCY) Your body goes through many changes during pregnancy. They vary from person to person. Talk to your caregiver about changes you notice and are concerned about. Changes can include:  Your menstrual period stops.   The egg and sperm carry the genes that determine what you look like. Genes from you and your partner are forming a baby. The female genes determine whether the baby is a boy or a girl.   Your body increases in girth and you may feel bloated.   Feeling sick to your stomach (nauseous) and throwing up (vomiting). If the vomiting is uncontrollable, call your caregiver.   Your breasts will begin to enlarge and become tender.   Your nipples may stick out more and become darker.   The need to urinate more. Painful urination may mean you have a bladder infection.   Tiring easily.   Loss of appetite.   Cravings for certain kinds of food.   At first, you may gain or lose a couple of pounds.   You may have changes in your emotions from day to day (excited to be pregnant or concerned something may go wrong with the pregnancy and baby).     You may have more vivid and strange dreams.  HOME CARE INSTRUCTIONS   It is very important to avoid all smoking, alcohol and un-prescribed drugs during your pregnancy. These affect the formation and growth of the baby. Avoid chemicals while pregnant to ensure the delivery of a healthy infant.   Start your prenatal visits by the 12th week of pregnancy. They are usually scheduled monthly at first, then more often in the last 2 months before delivery. Keep your caregiver's appointments. Follow your caregiver's instructions regarding medication use, blood and lab  tests, exercise, and diet.   During pregnancy, you are providing food for you and your baby. Eat regular, well-balanced meals. Choose foods such as meat, fish, milk and other low fat dairy products, vegetables, fruits, and whole-grain breads and cereals. Your caregiver will tell you of the ideal weight gain.   You can help morning sickness by keeping soda crackers at the bedside. Eat a couple before arising in the morning. You may want to use the crackers without salt on them.   Eating 4 to 5 small meals rather than 3 large meals a day also may help the nausea and vomiting.   Drinking liquids between meals instead of during meals also seems to help nausea and vomiting.   A physical sexual relationship may be continued throughout pregnancy if there are no other problems. Problems may be early (premature) leaking of amniotic fluid from the membranes, vaginal bleeding, or belly (abdominal) pain.   Exercise regularly if there are no restrictions. Check with your caregiver or physical therapist if you are unsure of the safety of some of your exercises. Greater weight gain will occur in the last 2 trimesters of pregnancy. Exercising will help:   Control your weight.   Keep you in shape.   Prepare you for labor and delivery.   Help you lose your pregnancy weight after you deliver your baby.   Wear a good support or jogging bra for breast tenderness during pregnancy. This may help if worn during sleep too.   Ask when prenatal classes are available. Begin classes when they are offered.   Do not use hot tubs, steam rooms or saunas.   Wear your seat belt when driving. This protects you and your baby if you are in an accident.   Avoid raw meat, uncooked cheese, cat litter boxes and soil used by cats throughout the pregnancy. These carry germs that can cause birth defects in the baby.   The first trimester is a good time to visit your dentist for your dental health. Getting your teeth cleaned is  OK. Use a softer toothbrush and brush gently during pregnancy.   Ask for help if you have financial, counseling or nutritional needs during pregnancy. Your caregiver will be able to offer counseling for these needs as well as refer you for other special needs.   Do not take any medications or herbs unless told by your caregiver.   Inform your caregiver if there is any mental or physical domestic violence.   Make a list of emergency phone numbers of family, friends, hospital, and police and fire departments.   Write down your questions. Take them to your prenatal visit.   Do not douche.   Do not cross your legs.   If you have to stand for long periods of time, rotate you feet or take small steps in a circle.   You may have more vaginal secretions that may require a sanitary pad. Do not use   tampons or scented sanitary pads.  MEDICATIONS AND DRUG USE IN PREGNANCY  Take prenatal vitamins as directed. The vitamin should contain 1 milligram of folic acid. Keep all vitamins out of reach of children. Only a couple vitamins or tablets containing iron may be fatal to a baby or young child when ingested.   Avoid use of all medications, including herbs, over-the-counter medications, not prescribed or suggested by your caregiver. Only take over-the-counter or prescription medicines for pain, discomfort, or fever as directed by your caregiver. Do not use aspirin, ibuprofen, or naproxen unless directed by your caregiver.   Let your caregiver also know about herbs you may be using.   Alcohol is related to a number of birth defects. This includes fetal alcohol syndrome. All alcohol, in any form, should be avoided completely. Smoking will cause low birth rate and premature babies.   Street or illegal drugs are very harmful to the baby. They are absolutely forbidden. A baby born to an addicted mother will be addicted at birth. The baby will go through the same withdrawal an adult does.   Let your  caregiver know about any medications that you have to take and for what reason you take them.  MISCARRIAGE IS COMMON DURING PREGNANCY A miscarriage does not mean you did something wrong. It is not a reason to worry about getting pregnant again. Your caregiver will help you with questions you may have. If you have a miscarriage, you may need minor surgery. SEEK MEDICAL CARE IF:  You have any concerns or worries during your pregnancy. It is better to call with your questions if you feel they cannot wait, rather than worry about them. SEEK IMMEDIATE MEDICAL CARE IF:   An unexplained oral temperature above 102 F (38.9 C) develops, or as your caregiver suggests.   You have leaking of fluid from the vagina (birth canal). If leaking membranes are suspected, take your temperature and inform your caregiver of this when you call.   There is vaginal spotting or bleeding. Notify your caregiver of the amount and how many pads are used.   You develop a bad smelling vaginal discharge with a change in the color.   You continue to feel sick to your stomach (nauseated) and have no relief from remedies suggested. You vomit blood or coffee ground-like materials.   You lose more than 2 pounds of weight in 1 week.   You gain more than 2 pounds of weight in 1 week and you notice swelling of your face, hands, feet, or legs.   You gain 5 pounds or more in 1 week (even if you do not have swelling of your hands, face, legs, or feet).   You get exposed to German measles and have never had them.   You are exposed to fifth disease or chickenpox.   You develop belly (abdominal) pain. Round ligament discomfort is a common non-cancerous (benign) cause of abdominal pain in pregnancy. Your caregiver still must evaluate this.   You develop headache, fever, diarrhea, pain with urination, or shortness of breath.   You fall or are in a car accident or have any kind of trauma.   There is mental or physical violence in  your home.  Document Released: 07/06/2001 Document Revised: 03/24/2011 Document Reviewed: 01/07/2009 [employer] Patient Information 2012 [employer]. 

## 2011-07-14 ENCOUNTER — Encounter: Payer: Self-pay | Admitting: Obstetrics and Gynecology

## 2011-07-14 ENCOUNTER — Ambulatory Visit (INDEPENDENT_AMBULATORY_CARE_PROVIDER_SITE_OTHER): Payer: BC Managed Care – PPO | Admitting: Obstetrics and Gynecology

## 2011-07-14 DIAGNOSIS — O0990 Supervision of high risk pregnancy, unspecified, unspecified trimester: Secondary | ICD-10-CM

## 2011-07-14 DIAGNOSIS — O26899 Other specified pregnancy related conditions, unspecified trimester: Secondary | ICD-10-CM

## 2011-07-14 DIAGNOSIS — Z6791 Unspecified blood type, Rh negative: Secondary | ICD-10-CM

## 2011-07-14 DIAGNOSIS — O360990 Maternal care for other rhesus isoimmunization, unspecified trimester, not applicable or unspecified: Secondary | ICD-10-CM

## 2011-07-14 DIAGNOSIS — O09899 Supervision of other high risk pregnancies, unspecified trimester: Secondary | ICD-10-CM

## 2011-07-14 DIAGNOSIS — O09219 Supervision of pregnancy with history of pre-term labor, unspecified trimester: Secondary | ICD-10-CM

## 2011-07-14 HISTORY — DX: Other specified pregnancy related conditions, unspecified trimester: O26.899

## 2011-07-14 HISTORY — DX: Unspecified blood type, rh negative: Z67.91

## 2011-07-14 NOTE — Progress Notes (Signed)
Patient doing well without complaints. RTC in 4 weeks to initiate weekly 17-OHP injections. Anatomy ultrasound to be scheduled at next visit.

## 2011-07-27 NOTE — L&D Delivery Note (Signed)
Delivery Note At 10:41 PM a viable female was delivered via Vaginal, Spontaneous Delivery (Presentation: Left Occiput Anterior).  APGAR: 7, 9; weight pending. Placenta status: Intact, Spontaneous.  Cord: 3 vessels with the following complications: None.   Anesthesia: Epidural  Episiotomy: None Lacerations: 2nd degree;Perineal Suture Repair: 3.0 vicryl Est. Blood Loss (mL): 400  Mom to postpartum.  Baby to nursery-stable.  Nellene Courtois JEHIEL 01/19/2012, 11:48 PM

## 2011-08-09 ENCOUNTER — Ambulatory Visit (INDEPENDENT_AMBULATORY_CARE_PROVIDER_SITE_OTHER): Payer: BC Managed Care – PPO | Admitting: Family Medicine

## 2011-08-09 VITALS — BP 79/53 | Wt 184.0 lb

## 2011-08-09 DIAGNOSIS — O09219 Supervision of pregnancy with history of pre-term labor, unspecified trimester: Secondary | ICD-10-CM

## 2011-08-09 DIAGNOSIS — O34219 Maternal care for unspecified type scar from previous cesarean delivery: Secondary | ICD-10-CM | POA: Insufficient documentation

## 2011-08-09 MED ORDER — HYDROXYPROGESTERONE CAPROATE 250 MG/ML IM OIL: 250.0000 mg | TOPICAL_OIL | Freq: Once | INTRAMUSCULAR | Status: DC

## 2011-08-09 MED ORDER — HYDROXYPROGESTERONE CAPROATE 250 MG/ML IM OIL
250.0000 mg | TOPICAL_OIL | Freq: Once | INTRAMUSCULAR | Status: AC
  Administered 2011-08-09: 250 mg via INTRAMUSCULAR

## 2011-08-09 NOTE — Progress Notes (Signed)
Start 17 P today

## 2011-08-09 NOTE — Patient Instructions (Addendum)
Vaginal Birth After Cesarean Delivery Vaginal birth after Cesarean delivery (VBAC) is giving birth vaginally after previously delivering a baby by a cesarean. In the past, if a woman had a Cesarean delivery, all births afterwards would be done by Cesarean delivery. This is no longer true. It can be safe for the mother to try a vaginal delivery after having a Cesarean. The final decision to have a VBAC or repeat Cesarean delivery should be between the patient and her caregiver. The risks and benefits can be discussed relative to the reason for, and the type of the previous Cesarean delivery. WOMEN WHO PLAN TO HAVE A VBAC SHOULD CHECK WITH THEIR DOCTOR TO BE SURE THAT:  The previous Cesarean was done with a low transverse uterine incision (not a vertical classical incision).   The birth canal is big enough for the baby.   There were no other operations on the uterus.   They will have an electronic fetal monitor (EFM) on at all times during labor.   An operating room would be available and ready in case an emergency Cesarean is needed.   A doctor and surgical nursing staff would be available at all times during labor to be ready to do an emergency Cesarean if necessary.   An anesthesiologist would be present in case an emergency Cesarean is needed.   The nursery is prepared and has adequate personnel and necessary equipment available to care for the baby in case of an emergency Cesarean.  BENEFITS OF VBAC:  Shorter stay in the hospital.   Lower delivery, nursery and hospital costs.   Less blood loss and need for blood transfusions.   Less fever and discomfort from major surgery.   Lower risk of blood clots.   Lower risk of infection.   Shorter recovery after going home.   Lower risk of other surgical complications, such as opening of the incision or hernia in the incision.   Decreased risk of injury to other organs.   Decreased risk for having to remove the uterus (hysterectomy).     Decreased risk for the placenta to completely or partially cover the opening of the uterus (placenta previa) with a future pregnancy.   Ability to have a larger family if desired.  RISKS OF A VBAC:  Rupture of the uterus.   Having to remove the uterus (hysterectomy) if it ruptures.   All the complications of major surgery and/or injury to other organs.   Excessive bleeding, blood clots and infection.   Lower Apgar scores (method to evaluate the newborn based on appearance, pulse, grimace, activity, and respiration) and more risks to the baby.   There is a higher risk of uterine rupture if you induce or augment labor.   There is a higher risk of uterine rupture if you use medications to ripen the cervix.  VBAC SHOULD NOT BE DONE IF:  The previous Cesarean was done with a vertical (classical) or T-shaped incision, or you do not know what kind of an incision was made.   You had a ruptured uterus.   You had surgery on your uterus.   You have medical or obstetrical problems.   There are problems with the baby.   There were two previous Cesarean deliveries and no vaginal deliveries.  OTHER FACTS TO KNOW ABOUT VBAC:  It is safe to have an epidural anesthetic with VBAC.   It is safe to turn the baby from a breech position (attempt an external cephalic version).   It is  safe to try a VBAC with twins.   Pregnancies later than 40 weeks have not been successful with VBAC.   There is an increased failure rate of a VBAC in obese pregnant women.   There is an increased failure rate with VABC if the baby weighs 8.8 pounds (4000 grams) or more.   There is an increased failure rate if the time between the Cesarean and VBAC is less than 19 months.   There is an increased failure rate if pre-eclampsia is present (high blood pressure, protein in the urine and swelling of face and extremities).   VBAC is very successful if there was a previous vaginal birth.   VBAC is very successful  when the labor starts spontaneously before the due date.   Delivery of VBAC is similar to having a normal spontaneous vaginal delivery.  It is important to discuss VBAC with your caregiver early in the pregnancy so you can understand the risks, benefits and options. It will give you time to decide what is best in your particular case relevant to the reason for your previous Cesarean delivery. It should be understood that medical changes in the mother or pregnancy may occur during the pregnancy, which make it necessary to change you or your caregiver's initial decision. The counseling, concerns and decisions should be documented in the medical record and signed by all parties. Document Released: 01/02/2007 Document Revised: 03/24/2011 Document Reviewed: 08/23/2008 [facility] Patient Information 2012 [place], [place]. Progesterone injection What is this medicine? PROGESTERONE (proe JES ter one) is a female hormone. It is used to prevent pre-term labor. This medicine may be used for other purposes; ask your health care provider or pharmacist if you have questions. What should I tell my health care provider before I take this medicine? -pregnant or trying to get pregnant -breast-feeding How should I use this medicine? This medicine is for injection into a muscle. It is usually given by a health care professional in a hospital or clinic setting. If you get this medicine at home, you will be taught how to prepare and give this medicine. Use exactly as directed. Take your medicine at regular intervals. Do not take your medicine more often than directed. It is important that you put your used needles and syringes in a special sharps container. Do not put them in a trash can. If you do not have a sharps container, call your pharmacist or healthcare provider to get one. Talk to your pediatrician regarding the use of this medicine in children. Special care may be needed. Overdosage: If you think you have taken  too much of this medicine contact a poison control center or emergency room at once. NOTE: This medicine is only for you. Do not share this medicine with others. What if I miss a dose? If you miss a dose, take it as soon as you can. If it is almost time for your next dose, take only that dose. Do not take double or extra doses. What may interact with this medicine? Do not take this medicine with any of the following medications: -bosentan This medicine may also interact with the following medications: -barbiturate medicines for sleep or seizures -bexarotene -carbamazepine -ethotoin -ketoconazole -phenytoin -rifampin This list may not describe all possible interactions. Give your health care provider a list of all the medicines, herbs, non-prescription drugs, or dietary supplements you use. Also tell them if you smoke, drink alcohol, or use illegal drugs. Some items may interact with your medicine. What should I watch  for while using this medicine? Your condition will be monitored carefully while you are receiving this medicine. Tell your doctor or healthcare professional if your symptoms do not start to get better or if they get worse. What side effects may I notice from receiving this medicine? Side effects that you should report to your doctor or health care professional as soon as possible: -allergic reactions like skin rash, itching or hives, swelling of the face, lips, or tongue -breast tissue changes or discharge -breathing problems -changes in vaginal bleeding during your period or between your periods -changes in vision -depression -numbness or pain in the arm or leg -pain at site where injected -pain, swelling, warmth in the leg -problems with balance, talking, walking -sudden severe headache -unusually weak or tired -yellowing of the eyes or skin Side effects that usually do not require medical attention (report to your doctor or health care professional if they continue or  are bothersome): -changes in emotions or moods -fluid retention and swelling -hair loss -increased in appetite -nausea -trouble sleeping This list may not describe all possible side effects. Call your doctor for medical advice about side effects. You may report side effects to FDA at 1-800-FDA-1088. Where should I keep my medicine? Keep out of the reach of children. If you are using this medicine at home, you will be instructed on how to store this medicine. Throw away any unused medicine after the expiration date on the label. NOTE: This sheet is a summary. It may not cover all possible information. If you have questions about this medicine, talk to your doctor, pharmacist, or health care provider.  2012, [employer]. (02/12/2008 5:05:08 PM) Pregnancy - Second Trimester The second trimester of pregnancy (3 to 6 months) is a period of rapid growth for you and your baby. At the end of the sixth month, your baby is about 9 inches long and weighs 1 1/2 pounds. You will begin to feel the baby move between 18 and 20 weeks of the pregnancy. This is called quickening. Weight gain is faster. A clear fluid (colostrum) may leak out of your breasts. You may feel small contractions of the womb (uterus). This is known as false labor or Braxton-Hicks contractions. This is like a practice for labor when the baby is ready to be born. Usually, the problems with morning sickness have usually passed by the end of your first trimester. Some women develop small dark blotches (called cholasma, mask of pregnancy) on their face that usually goes away after the baby is born. Exposure to the sun makes the blotches worse. Acne may also develop in some pregnant women and pregnant women who have acne, may find that it goes away. PRENATAL EXAMS  Blood work may continue to be done during prenatal exams. These tests are done to check on your health and the probable health of your baby. Blood work is used to follow your  blood levels (hemoglobin). Anemia (low hemoglobin) is common during pregnancy. Iron and vitamins are given to help prevent this. You will also be checked for diabetes between 24 and 28 weeks of the pregnancy. Some of the previous blood tests may be repeated.   The size of the uterus is measured during each visit. This is to make sure that the baby is continuing to grow properly according to the dates of the pregnancy.   Your blood pressure is checked every prenatal visit. This is to make sure you are not getting toxemia.   Your urine is checked  to make sure you do not have an infection, diabetes or protein in the urine.   Your weight is checked often to make sure gains are happening at the suggested rate. This is to ensure that both you and your baby are growing normally.   Sometimes, an ultrasound is performed to confirm the proper growth and development of the baby. This is a test which bounces harmless sound waves off the baby so your caregiver can more accurately determine due dates.  Sometimes, a specialized test is done on the amniotic fluid surrounding the baby. This test is called an amniocentesis. The amniotic fluid is obtained by sticking a needle into the belly (abdomen). This is done to check the chromosomes in instances where there is a concern about possible genetic problems with the baby. It is also sometimes done near the end of pregnancy if an early delivery is required. In this case, it is done to help make sure the baby's lungs are mature enough for the baby to live outside of the womb. CHANGES OCCURING IN THE SECOND TRIMESTER OF PREGNANCY Your body goes through many changes during pregnancy. They vary from person to person. Talk to your caregiver about changes you notice that you are concerned about.  During the second trimester, you will likely have an increase in your appetite. It is normal to have cravings for certain foods. This varies from person to person and pregnancy to  pregnancy.   Your lower abdomen will begin to bulge.   You may have to urinate more often because the uterus and baby are pressing on your bladder. It is also common to get more bladder infections during pregnancy (pain with urination). You can help this by drinking lots of fluids and emptying your bladder before and after intercourse.   You may begin to get stretch marks on your hips, abdomen, and breasts. These are normal changes in the body during pregnancy. There are no exercises or medications to take that prevent this change.   You may begin to develop swollen and bulging veins (varicose veins) in your legs. Wearing support hose, elevating your feet for 15 minutes, 3 to 4 times a day and limiting salt in your diet helps lessen the problem.   Heartburn may develop as the uterus grows and pushes up against the stomach. Antacids recommended by your caregiver helps with this problem. Also, eating smaller meals 4 to 5 times a day helps.   Constipation can be treated with a stool softener or adding bulk to your diet. Drinking lots of fluids, vegetables, fruits, and whole grains are helpful.   Exercising is also helpful. If you have been very active up until your pregnancy, most of these activities can be continued during your pregnancy. If you have been less active, it is helpful to start an exercise program such as walking.   Hemorrhoids (varicose veins in the rectum) may develop at the end of the second trimester. Warm sitz baths and hemorrhoid cream recommended by your caregiver helps hemorrhoid problems.   Backaches may develop during this time of your pregnancy. Avoid heavy lifting, wear low heal shoes and practice good posture to help with backache problems.   Some pregnant women develop tingling and numbness of their hand and fingers because of swelling and tightening of ligaments in the wrist (carpel tunnel syndrome). This goes away after the baby is born.   As your breasts enlarge, you  may have to get a bigger bra. Get a comfortable, cotton, support  bra. Do not get a nursing bra until the last month of the pregnancy if you will be nursing the baby.   You may get a dark line from your belly button to the pubic area called the linea nigra.   You may develop rosy cheeks because of increase blood flow to the face.   You may develop spider looking lines of the face, neck, arms and chest. These go away after the baby is born.  HOME CARE INSTRUCTIONS   It is extremely important to avoid all smoking, herbs, alcohol, and unprescribed drugs during your pregnancy. These chemicals affect the formation and growth of the baby. Avoid these chemicals throughout the pregnancy to ensure the delivery of a healthy infant.   Most of your home care instructions are the same as suggested for the first trimester of your pregnancy. Keep your caregiver's appointments. Follow your caregiver's instructions regarding medication use, exercise and diet.   During pregnancy, you are providing food for you and your baby. Continue to eat regular, well-balanced meals. Choose foods such as meat, fish, milk and other low fat dairy products, vegetables, fruits, and whole-grain breads and cereals. Your caregiver will tell you of the ideal weight gain.   A physical sexual relationship may be continued up until near the end of pregnancy if there are no other problems. Problems could include early (premature) leaking of amniotic fluid from the membranes, vaginal bleeding, abdominal pain, or other medical or pregnancy problems.   Exercise regularly if there are no restrictions. Check with your caregiver if you are unsure of the safety of some of your exercises. The greatest weight gain will occur in the last 2 trimesters of pregnancy. Exercise will help you:   Control your weight.   Get you in shape for labor and delivery.   Lose weight after you have the baby.   Wear a good support or jogging bra for breast  tenderness during pregnancy. This may help if worn during sleep. Pads or tissues may be used in the bra if you are leaking colostrum.   Do not use hot tubs, steam rooms or saunas throughout the pregnancy.   Wear your seat belt at all times when driving. This protects you and your baby if you are in an accident.   Avoid raw meat, uncooked cheese, cat litter boxes and soil used by cats. These carry germs that can cause birth defects in the baby.   The second trimester is also a good time to visit your dentist for your dental health if this has not been done yet. Getting your teeth cleaned is OK. Use a soft toothbrush. Brush gently during pregnancy.   It is easier to loose urine during pregnancy. Tightening up and strengthening the pelvic muscles will help with this problem. Practice stopping your urination while you are going to the bathroom. These are the same muscles you need to strengthen. It is also the muscles you would use as if you were trying to stop from passing gas. You can practice tightening these muscles up 10 times a set and repeating this about 3 times per day. Once you know what muscles to tighten up, do not perform these exercises during urination. It is more likely to contribute to an infection by backing up the urine.   Ask for help if you have financial, counseling or nutritional needs during pregnancy. Your caregiver will be able to offer counseling for these needs as well as refer you for other special needs.  Your skin may become oily. If so, wash your face with mild soap, use non-greasy moisturizer and oil or cream based makeup.  MEDICATIONS AND DRUG USE IN PREGNANCY  Take prenatal vitamins as directed. The vitamin should contain 1 milligram of folic acid. Keep all vitamins out of reach of children. Only a couple vitamins or tablets containing iron may be fatal to a baby or young child when ingested.   Avoid use of all medications, including herbs, over-the-counter  medications, not prescribed or suggested by your caregiver. Only take over-the-counter or prescription medicines for pain, discomfort, or fever as directed by your caregiver. Do not use aspirin.   Let your caregiver also know about herbs you may be using.   Alcohol is related to a number of birth defects. This includes fetal alcohol syndrome. All alcohol, in any form, should be avoided completely. Smoking will cause low birth rate and premature babies.   Street or illegal drugs are very harmful to the baby. They are absolutely forbidden. A baby born to an addicted mother will be addicted at birth. The baby will go through the same withdrawal an adult does.  SEEK MEDICAL CARE IF:  You have any concerns or worries during your pregnancy. It is better to call with your questions if you feel they cannot wait, rather than worry about them. SEEK IMMEDIATE MEDICAL CARE IF:   An unexplained oral temperature above 102 F (38.9 C) develops, or as your caregiver suggests.   You have leaking of fluid from the vagina (birth canal). If leaking membranes are suspected, take your temperature and tell your caregiver of this when you call.   There is vaginal spotting, bleeding, or passing clots. Tell your caregiver of the amount and how many pads are used. Light spotting in pregnancy is common, especially following intercourse.   You develop a bad smelling vaginal discharge with a change in the color from clear to white.   You continue to feel sick to your stomach (nauseated) and have no relief from remedies suggested. You vomit blood or coffee ground-like materials.   You lose more than 2 pounds of weight or gain more than 2 pounds of weight over 1 week, or as suggested by your caregiver.   You notice swelling of your face, hands, feet, or legs.   You get exposed to [place] measles and have never had them.   You are exposed to fifth disease or chickenpox.   You develop belly (abdominal) pain. Round  ligament discomfort is a common non-cancerous (benign) cause of abdominal pain in pregnancy. Your caregiver still must evaluate you.   You develop a bad headache that does not go away.   You develop fever, diarrhea, pain with urination, or shortness of breath.   You develop visual problems, blurry, or double vision.   You fall or are in a car accident or any kind of trauma.   There is mental or physical violence at home.  Document Released: 07/06/2001 Document Revised: 03/24/2011 Document Reviewed: 01/08/2009 [facility] Patient Information 2012 [place], [place]. Breastfeeding BENEFITS OF BREASTFEEDING For the baby  The first milk (colostrum) helps the baby's digestive system function better.   There are antibodies from the mother in the milk that help the baby fight off infections.   The baby has a lower incidence of asthma, allergies, and SIDS (sudden infant death syndrome).   The nutrients in breast milk are better than formulas for the baby and helps the baby's brain grow better.   Babies  who breastfeed have less gas, colic, and constipation.  For the mother  Breastfeeding helps develop a very special bond between mother and baby.   It is more convenient, always available at the correct temperature and cheaper than formula feeding.   It burns calories in the mother and helps with losing weight that was gained during pregnancy.   It makes the uterus contract back down to normal size faster and slows bleeding following delivery.   Breastfeeding mothers have a lower risk of developing breast cancer.  NURSE FREQUENTLY  A healthy, full-term baby may breastfeed as often as every hour or space his or her feedings to every 3 hours.   How often to nurse will vary from baby to baby. Watch your baby for signs of hunger, not the clock.   Nurse as often as the baby requests, or when you feel the need to reduce the fullness of your breasts.   Awaken the baby if it has been 3 to 4 hours  since the last feeding.   Frequent feeding will help the mother make more milk and will prevent problems like sore nipples and engorgement of the breasts.  BABY'S POSITION AT THE BREAST  Whether lying down or sitting, be sure that the baby's tummy is facing your tummy.   Support the breast with 4 fingers underneath the breast and the thumb above. Make sure your fingers are well away from the nipple and baby's mouth.   Stroke the baby's lips and cheek closest to the breast gently with your finger or nipple.   When the baby's mouth is open wide enough, place all of your nipple and as much of the dark area around the nipple as possible into your baby's mouth.   Pull the baby in close so the tip of the nose and the baby's cheeks touch the breast during the feeding.  FEEDINGS  The length of each feeding varies from baby to baby and from feeding to feeding.   The baby must suck about 2 to 3 minutes for your milk to get to him or her. This is called a "let down." For this reason, allow the baby to feed on each breast as long as he or she wants. Your baby will end the feeding when he or she has received the right balance of nutrients.   To break the suction, put your finger into the corner of the baby's mouth and slide it between his or her gums before removing your breast from his or her mouth. This will help prevent sore nipples.  REDUCING BREAST ENGORGEMENT  In the first week after your baby is born, you may experience signs of breast engorgement. When breasts are engorged, they feel heavy, warm, full, and may be tender to the touch. You can reduce engorgement if you:   Nurse frequently, every 2 to 3 hours. Mothers who breastfeed early and often have fewer problems with engorgement.   Place light ice packs on your breasts between feedings. This reduces swelling. Wrap the ice packs in a lightweight towel to protect your skin.   Apply moist hot packs to your breast for 5 to 10 minutes before  each feeding. This increases circulation and helps the milk flow.   Gently massage your breast before and during the feeding.   Make sure that the baby empties at least one breast at every feeding before switching sides.   Use a breast pump to empty the breasts if your baby is sleepy or not  nursing well. You may also want to pump if you are returning to work or or you feel you are getting engorged.   Avoid bottle feeds, pacifiers or supplemental feedings of water or juice in place of breastfeeding.   Be sure the baby is latched on and positioned properly while breastfeeding.   Prevent fatigue, stress, and anemia.   Wear a supportive bra, avoiding underwire styles.   Eat a balanced diet with enough fluids.  If you follow these suggestions, your engorgement should improve in 24 to 48 hours. If you are still experiencing difficulty, call your lactation consultant or caregiver. IS MY BABY GETTING ENOUGH MILK? Sometimes, mothers worry about whether their babies are getting enough milk. You can be assured that your baby is getting enough milk if:  The baby is actively sucking and you hear swallowing.   The baby nurses at least 8 to 12 times in a 24 hour time period. Nurse your baby until he or she unlatches or falls asleep at the first breast (at least 10 to 20 minutes), then offer the second side.   The baby is wetting 5 to 6 disposable diapers (6 to 8 cloth diapers) in a 24 hour period by 58 to 21 days of age.   The baby is having at least 2 to 3 stools every 24 hours for the first few months. Breast milk is all the food your baby needs. It is not necessary for your baby to have water or formula. In fact, to help your breasts make more milk, it is best not to give your baby supplemental feedings during the early weeks.   The stool should be soft and yellow.   The baby should gain 4 to 7 ounces per week after he is 90 days old.  TAKE CARE OF YOURSELF Take care of your breasts by:  Bathing  or showering daily.   Avoiding the use of soaps on your nipples.   Start feedings on your left breast at one feeding and on your right breast at the next feeding.   You will notice an increase in your milk supply 2 to 5 days after delivery. You may feel some discomfort from engorgement, which makes your breasts very firm and often tender. Engorgement "peaks" out within 24 to 48 hours. In the meantime, apply warm moist towels to your breasts for 5 to 10 minutes before feeding. Gentle massage and expression of some milk before feeding will soften your breasts, making it easier for your baby to latch on. Wear a well fitting nursing bra and air dry your nipples for 10 to 15 minutes after each feeding.   Only use cotton bra pads.   Only use pure lanolin on your nipples after nursing. You do not need to wash it off before nursing.  Take care of yourself by:   Eating well-balanced meals and nutritious snacks.   Drinking milk, fruit juice, and water to satisfy your thirst (about 8 glasses a day).   Getting plenty of rest.   Increasing calcium in your diet (1200 mg a day).   Avoiding foods that you notice affect the baby in a bad way.  SEEK MEDICAL CARE IF:   You have any questions or difficulty with breastfeeding.   You need help.   You have a hard, red, sore area on your breast, accompanied by a fever of 100.5 F (38.1 C) or more.   Your baby is too sleepy to eat well or is having trouble sleeping.  Your baby is wetting less than 6 diapers per day, by 56 days of age.   Your baby's skin or white part of his or her eyes is more yellow than it was in the hospital.   You feel depressed.  Document Released: 07/12/2005 Document Revised: 03/24/2011 Document Reviewed: 02/24/2009 [facility] Patient Information 2012 [place], [place].

## 2011-08-16 ENCOUNTER — Other Ambulatory Visit: Payer: BC Managed Care – PPO

## 2011-08-16 ENCOUNTER — Other Ambulatory Visit (INDEPENDENT_AMBULATORY_CARE_PROVIDER_SITE_OTHER): Payer: BC Managed Care – PPO | Admitting: *Deleted

## 2011-08-16 DIAGNOSIS — O09219 Supervision of pregnancy with history of pre-term labor, unspecified trimester: Secondary | ICD-10-CM

## 2011-08-16 MED ORDER — HYDROXYPROGESTERONE CAPROATE 250 MG/ML IM OIL
250.0000 mg | TOPICAL_OIL | INTRAMUSCULAR | Status: DC
  Administered 2011-08-16 – 2011-12-28 (×20): 250 mg via INTRAMUSCULAR

## 2011-08-16 NOTE — Progress Notes (Signed)
Patient is here today for weekly 17-p injection.

## 2011-08-19 ENCOUNTER — Other Ambulatory Visit: Payer: Self-pay | Admitting: Obstetrics & Gynecology

## 2011-08-19 DIAGNOSIS — Z3689 Encounter for other specified antenatal screening: Secondary | ICD-10-CM

## 2011-08-23 ENCOUNTER — Other Ambulatory Visit: Payer: BC Managed Care – PPO

## 2011-08-23 ENCOUNTER — Other Ambulatory Visit (INDEPENDENT_AMBULATORY_CARE_PROVIDER_SITE_OTHER): Payer: BC Managed Care – PPO | Admitting: *Deleted

## 2011-08-23 DIAGNOSIS — O09219 Supervision of pregnancy with history of pre-term labor, unspecified trimester: Secondary | ICD-10-CM

## 2011-08-23 NOTE — Progress Notes (Signed)
Patient is here for weekly 17-P injection.  She is doing well. 

## 2011-08-30 ENCOUNTER — Other Ambulatory Visit (INDEPENDENT_AMBULATORY_CARE_PROVIDER_SITE_OTHER): Payer: BC Managed Care – PPO | Admitting: *Deleted

## 2011-08-30 DIAGNOSIS — O09219 Supervision of pregnancy with history of pre-term labor, unspecified trimester: Secondary | ICD-10-CM

## 2011-08-30 NOTE — Progress Notes (Signed)
Patient is here today for weekly 17-P.

## 2011-09-06 ENCOUNTER — Ambulatory Visit (INDEPENDENT_AMBULATORY_CARE_PROVIDER_SITE_OTHER): Payer: BC Managed Care – PPO | Admitting: Obstetrics & Gynecology

## 2011-09-06 VITALS — Wt 195.0 lb

## 2011-09-06 DIAGNOSIS — Z23 Encounter for immunization: Secondary | ICD-10-CM

## 2011-09-06 DIAGNOSIS — O09219 Supervision of pregnancy with history of pre-term labor, unspecified trimester: Secondary | ICD-10-CM

## 2011-09-06 DIAGNOSIS — Z3480 Encounter for supervision of other normal pregnancy, unspecified trimester: Secondary | ICD-10-CM

## 2011-09-06 NOTE — Progress Notes (Signed)
Patient here for routine prenatal check.  She is doing well.  Receiving weekly 17-OHP injections.

## 2011-09-06 NOTE — Progress Notes (Signed)
Routine visit plus 17 P shot. No problems, good FM, no signs/symptoms of PTL. She has an anatomy scan scheduled for Friday. She agrees to get the flu shot today. She declines a Quad screen/did not get 1st trimester screen either.

## 2011-09-07 ENCOUNTER — Ambulatory Visit (HOSPITAL_COMMUNITY): Payer: BC Managed Care – PPO

## 2011-09-08 ENCOUNTER — Ambulatory Visit (HOSPITAL_COMMUNITY): Payer: BC Managed Care – PPO

## 2011-09-09 ENCOUNTER — Telehealth: Payer: Self-pay | Admitting: *Deleted

## 2011-09-09 DIAGNOSIS — Z3480 Encounter for supervision of other normal pregnancy, unspecified trimester: Secondary | ICD-10-CM

## 2011-09-09 DIAGNOSIS — Z3689 Encounter for other specified antenatal screening: Secondary | ICD-10-CM

## 2011-09-09 NOTE — Telephone Encounter (Signed)
Radiology needs to have order released for ultrasound.

## 2011-09-10 ENCOUNTER — Ambulatory Visit (HOSPITAL_COMMUNITY)
Admission: RE | Admit: 2011-09-10 | Discharge: 2011-09-10 | Disposition: A | Discharge status: Home / Self Care | Payer: BC Managed Care – PPO | Source: Ambulatory Visit | Attending: Obstetrics & Gynecology | Admitting: Obstetrics & Gynecology

## 2011-09-10 DIAGNOSIS — Z1389 Encounter for screening for other disorder: Secondary | ICD-10-CM | POA: Insufficient documentation

## 2011-09-10 DIAGNOSIS — Z363 Encounter for antenatal screening for malformations: Secondary | ICD-10-CM | POA: Insufficient documentation

## 2011-09-10 DIAGNOSIS — O358XX0 Maternal care for other (suspected) fetal abnormality and damage, not applicable or unspecified: Secondary | ICD-10-CM | POA: Insufficient documentation

## 2011-09-10 DIAGNOSIS — O34219 Maternal care for unspecified type scar from previous cesarean delivery: Secondary | ICD-10-CM | POA: Insufficient documentation

## 2011-09-13 ENCOUNTER — Ambulatory Visit (INDEPENDENT_AMBULATORY_CARE_PROVIDER_SITE_OTHER): Payer: BC Managed Care – PPO | Admitting: *Deleted

## 2011-09-13 DIAGNOSIS — O09219 Supervision of pregnancy with history of pre-term labor, unspecified trimester: Secondary | ICD-10-CM

## 2011-09-20 ENCOUNTER — Ambulatory Visit (INDEPENDENT_AMBULATORY_CARE_PROVIDER_SITE_OTHER): Payer: BC Managed Care – PPO | Admitting: *Deleted

## 2011-09-20 DIAGNOSIS — O09219 Supervision of pregnancy with history of pre-term labor, unspecified trimester: Secondary | ICD-10-CM

## 2011-09-20 NOTE — Progress Notes (Signed)
Patient is here for 17-P injection.  She is doing well. 

## 2011-09-27 ENCOUNTER — Ambulatory Visit: Payer: BC Managed Care – PPO

## 2011-09-27 ENCOUNTER — Ambulatory Visit (INDEPENDENT_AMBULATORY_CARE_PROVIDER_SITE_OTHER): Payer: BC Managed Care – PPO | Admitting: *Deleted

## 2011-09-27 DIAGNOSIS — O09219 Supervision of pregnancy with history of pre-term labor, unspecified trimester: Secondary | ICD-10-CM

## 2011-09-27 NOTE — Progress Notes (Signed)
Patient is here for weekly 17-P injection. 

## 2011-10-04 ENCOUNTER — Ambulatory Visit (INDEPENDENT_AMBULATORY_CARE_PROVIDER_SITE_OTHER): Payer: BC Managed Care – PPO | Admitting: Obstetrics & Gynecology

## 2011-10-04 DIAGNOSIS — O0990 Supervision of high risk pregnancy, unspecified, unspecified trimester: Secondary | ICD-10-CM

## 2011-10-04 DIAGNOSIS — O09219 Supervision of pregnancy with history of pre-term labor, unspecified trimester: Secondary | ICD-10-CM

## 2011-10-04 DIAGNOSIS — O34219 Maternal care for unspecified type scar from previous cesarean delivery: Secondary | ICD-10-CM

## 2011-10-04 NOTE — Progress Notes (Signed)
Routine visit. I recommended no more weight gain due to risks, incl stillbirth. She denies s/sx of PTL. She will get a 1 hour glucola at [facility].

## 2011-10-04 NOTE — Progress Notes (Signed)
She will need Rhophylac at [facility] also.

## 2011-10-11 ENCOUNTER — Ambulatory Visit (INDEPENDENT_AMBULATORY_CARE_PROVIDER_SITE_OTHER): Payer: BC Managed Care – PPO | Admitting: Obstetrics & Gynecology

## 2011-10-11 ENCOUNTER — Ambulatory Visit: Payer: BC Managed Care – PPO | Admitting: Obstetrics & Gynecology

## 2011-10-11 ENCOUNTER — Ambulatory Visit: Payer: BC Managed Care – PPO

## 2011-10-11 ENCOUNTER — Encounter: Payer: Self-pay | Admitting: Obstetrics & Gynecology

## 2011-10-11 VITALS — BP 105/70 | Wt 194.0 lb

## 2011-10-11 DIAGNOSIS — Z3480 Encounter for supervision of other normal pregnancy, unspecified trimester: Secondary | ICD-10-CM

## 2011-10-11 DIAGNOSIS — N898 Other specified noninflammatory disorders of vagina: Secondary | ICD-10-CM

## 2011-10-11 DIAGNOSIS — O09219 Supervision of pregnancy with history of pre-term labor, unspecified trimester: Secondary | ICD-10-CM

## 2011-10-11 DIAGNOSIS — O26899 Other specified pregnancy related conditions, unspecified trimester: Secondary | ICD-10-CM

## 2011-10-11 NOTE — Progress Notes (Signed)
Routine visit for 17 P. Complains of vaginal discharge. I sent a wet prep. She denies s/sx of PT. She has appt for glucola in 2 weeks.

## 2011-10-12 LAB — WET PREP, GENITAL: Clue Cells Wet Prep HPF POC: NEGATIVE

## 2011-10-18 ENCOUNTER — Ambulatory Visit (INDEPENDENT_AMBULATORY_CARE_PROVIDER_SITE_OTHER): Payer: BC Managed Care – PPO | Admitting: *Deleted

## 2011-10-18 DIAGNOSIS — O09219 Supervision of pregnancy with history of pre-term labor, unspecified trimester: Secondary | ICD-10-CM

## 2011-10-18 NOTE — Progress Notes (Signed)
Patient is here for weekly 17-p injection.  She is doing well.

## 2011-10-26 ENCOUNTER — Encounter: Payer: BC Managed Care – PPO | Admitting: Obstetrics & Gynecology

## 2011-10-27 ENCOUNTER — Ambulatory Visit: Payer: BC Managed Care – PPO | Admitting: Gynecology

## 2011-10-27 ENCOUNTER — Ambulatory Visit (INDEPENDENT_AMBULATORY_CARE_PROVIDER_SITE_OTHER): Payer: BC Managed Care – PPO | Admitting: Gynecology

## 2011-10-27 DIAGNOSIS — Z3480 Encounter for supervision of other normal pregnancy, unspecified trimester: Secondary | ICD-10-CM

## 2011-10-27 DIAGNOSIS — O09219 Supervision of pregnancy with history of pre-term labor, unspecified trimester: Secondary | ICD-10-CM

## 2011-10-27 NOTE — Patient Instructions (Signed)
Patient to follow up in one week for next 17p injection.

## 2011-11-02 ENCOUNTER — Inpatient Hospital Stay (HOSPITAL_COMMUNITY)
Admission: AD | Admit: 2011-11-02 | Discharge: 2011-11-02 | Disposition: A | Discharge status: Home / Self Care | Payer: BC Managed Care – PPO | Source: Ambulatory Visit | Attending: Obstetrics & Gynecology | Admitting: Obstetrics & Gynecology

## 2011-11-02 ENCOUNTER — Inpatient Hospital Stay (HOSPITAL_COMMUNITY): Payer: BC Managed Care – PPO

## 2011-11-02 ENCOUNTER — Encounter (HOSPITAL_COMMUNITY): Payer: Self-pay

## 2011-11-02 ENCOUNTER — Ambulatory Visit (INDEPENDENT_AMBULATORY_CARE_PROVIDER_SITE_OTHER): Payer: BC Managed Care – PPO | Admitting: Family Medicine

## 2011-11-02 VITALS — BP 96/68 | Wt 196.0 lb

## 2011-11-02 DIAGNOSIS — O34219 Maternal care for unspecified type scar from previous cesarean delivery: Secondary | ICD-10-CM

## 2011-11-02 DIAGNOSIS — O360990 Maternal care for other rhesus isoimmunization, unspecified trimester, not applicable or unspecified: Secondary | ICD-10-CM

## 2011-11-02 DIAGNOSIS — O09219 Supervision of pregnancy with history of pre-term labor, unspecified trimester: Secondary | ICD-10-CM

## 2011-11-02 DIAGNOSIS — Z6791 Unspecified blood type, Rh negative: Secondary | ICD-10-CM

## 2011-11-02 DIAGNOSIS — O4700 False labor before 37 completed weeks of gestation, unspecified trimester: Secondary | ICD-10-CM | POA: Insufficient documentation

## 2011-11-02 DIAGNOSIS — R109 Unspecified abdominal pain: Secondary | ICD-10-CM

## 2011-11-02 DIAGNOSIS — O26899 Other specified pregnancy related conditions, unspecified trimester: Secondary | ICD-10-CM

## 2011-11-02 DIAGNOSIS — O0990 Supervision of high risk pregnancy, unspecified, unspecified trimester: Secondary | ICD-10-CM

## 2011-11-02 DIAGNOSIS — Z3480 Encounter for supervision of other normal pregnancy, unspecified trimester: Secondary | ICD-10-CM

## 2011-11-02 MED ORDER — RHO D IMMUNE GLOBULIN 1500 UNIT/2ML IJ SOLN
300.0000 ug | Freq: Once | INTRAMUSCULAR | Status: AC
  Administered 2011-11-02: 300 ug via INTRAMUSCULAR

## 2011-11-02 MED ORDER — NIFEDIPINE 10 MG PO CAPS
10.0000 mg | ORAL_CAPSULE | ORAL | Status: AC | PRN
  Administered 2011-11-02 (×3): 10 mg via ORAL
  Filled 2011-11-02 (×3): qty 1

## 2011-11-02 MED ORDER — BETAMETHASONE SOD PHOS & ACET 6 (3-3) MG/ML IJ SUSP
12.0000 mg | Freq: Once | INTRAMUSCULAR | Status: AC
  Administered 2011-11-02: 12 mg via INTRAMUSCULAR
  Filled 2011-11-02: qty 2

## 2011-11-02 MED ORDER — BETAMETHASONE SOD PHOS & ACET 6 (3-3) MG/ML IJ SUSP: 12.5000 mg | Freq: Once | INTRAMUSCULAR | Status: DC

## 2011-11-02 NOTE — [facility] Provider Note (Signed)
Chief Complaint:  Contractions   First Provider Initiated Contact with Patient 11/02/11 1825      Alexandra Douglas is  31 y.o. [id number].  Patient's last menstrual period was 04/21/2011..  [redacted]w[redacted]d   She presents complaining of contractions.  Onset is described as ongoing and has been present for  1-2 days. Pt was sent from [facility] office for further evaluation of preterm contractions. Reports intermittent cramping for 1-2 days was seen by Dr. Shawnie Pons in office today. Cervix was noted to be FT/Soft/Anterior. Denies bleeding or LOF. Reports good FM.   Hx of PTB of twins at 29 weeks after PPROM.   Obstetrical/Gynecological History: OB History    Grav Para Term Preterm Abortions TAB SAB Ect Mult Living   2 1 0 1 0 0 0 0 1 2       Past Medical History: Past Medical History  Diagnosis Date  . PONV (postoperative nausea and vomiting)   . Preterm labor     Past Surgical History: Past Surgical History  Procedure Date  . Cesarean section 02/06/2008  . Wisdom tooth extraction     Family History: Family History  Problem Relation Age of Onset  . Arthritis Mother   . Hyperlipidemia Mother   . Heart disease Maternal Grandfather   . Anesthesia problems Neg Hx     Social History: History  Substance Use Topics  . Smoking status: Never Smoker   . Smokeless tobacco: Never Used  . Alcohol Use: No    Allergies: No Known Allergies  Prescriptions prior to admission  Medication Sig Dispense Refill  . Prenatal Vit-Fe Fumarate-FA (MULTIVITAMIN-PRENATAL) 27-0.8 MG TABS Take 1 tablet by mouth daily.         Review of Systems - Negative except what has been reviewed in HPI  Physical Exam   Blood pressure 110/65, pulse 82, temperature 98.5 F (36.9 C), temperature source Oral, resp. rate 18, height 5\' 3"  (1.6 m), weight 196 lb (88.905 kg), last menstrual period 04/21/2011.  General: General appearance - alert, well appearing, and in no distress, oriented to person, place, and time and  anxious Mental status - alert, oriented to person, place, and time, normal mood, behavior, speech, dress, motor activity, and thought processes, affect appropriate to mood Abdomen - gravid, non tender FHT: 145, mod variability, + 15x15 accels, no decels Toco:  ED Course: Will give procardia for contractions. Plan extended monitoring, will re-examine cervix in 1-2 hours.  MD Consult:8:01 PM Discussed with Dr. Marice Potter. Agrees with plan, additionally, will get CL [place] and give BMZ now.   Assessment: Preterm Contractions  Plan: 8:01 PM report given and care assumed by Philipp Deputy, CNM  SHORES,SUZANNE E. 11/02/2011,8:01 PM    Pt seen after returning from [place]. FHR 140s reactive and reassuring No ctx per toco Cx soft/CL/long Cx length 3.1cm  As per prev plan, will d/c home with PTL precautions and inst to return to [facility] at approx this same time tomorrow for FFN and 2nd BMZ- pt agreeable.  Cam Hai 11/02/2011 11:03 PM

## 2011-11-02 NOTE — Patient Instructions (Signed)
Pregnancy - Third Trimester The third trimester of pregnancy (the last 3 months) is a period of the most rapid growth for you and your baby. The baby approaches a length of 20 inches and a weight of 6 to 10 pounds. The baby is adding on fat and getting ready for life outside your body. While inside, babies have periods of sleeping and waking, suck their thumbs, and hiccups. You can often feel small contractions of the uterus. This is false labor. It is also called Braxton-Hicks contractions. This is like a practice for labor. The usual problems in this stage of pregnancy include more difficulty breathing, swelling of the hands and feet from water retention, and having to urinate more often because of the uterus and baby pressing on your bladder.  PRENATAL EXAMS  Blood work may continue to be done during prenatal exams. These tests are done to check on your health and the probable health of your baby. Blood work is used to follow your blood levels (hemoglobin). Anemia (low hemoglobin) is common during pregnancy. Iron and vitamins are given to help prevent this. You may also continue to be checked for diabetes. Some of the past blood tests may be done again.   The size of the uterus is measured during each visit. This makes sure your baby is growing properly according to your pregnancy dates.   Your blood pressure is checked every prenatal visit. This is to make sure you are not getting toxemia.   Your urine is checked every prenatal visit for infection, diabetes and protein.   Your weight is checked at each visit. This is done to make sure gains are happening at the suggested rate and that you and your baby are growing normally.   Sometimes, an ultrasound is performed to confirm the position and the proper growth and development of the baby. This is a test done that bounces harmless sound waves off the baby so your caregiver can more accurately determine due dates.   Discuss the type of pain  medication and anesthesia you will have during your labor and delivery.   Discuss the possibility and anesthesia if a Cesarean Section might be necessary.   Inform your caregiver if there is any mental or physical violence at home.  Sometimes, a specialized non-stress test, contraction stress test and biophysical profile are done to make sure the baby is not having a problem. Checking the amniotic fluid surrounding the baby is called an amniocentesis. The amniotic fluid is removed by sticking a needle into the belly (abdomen). This is sometimes done near the end of pregnancy if an early delivery is required. In this case, it is done to help make sure the baby's lungs are mature enough for the baby to live outside of the womb. If the lungs are not mature and it is unsafe to deliver the baby, an injection of cortisone medication is given to the mother 1 to 2 days before the delivery. This helps the baby's lungs mature and makes it safer to deliver the baby. CHANGES OCCURING IN THE THIRD TRIMESTER OF PREGNANCY Your body goes through many changes during pregnancy. They vary from person to person. Talk to your caregiver about changes you notice and are concerned about.  During the last trimester, you have probably had an increase in your appetite. It is normal to have cravings for certain foods. This varies from person to person and pregnancy to pregnancy.   You may begin to get stretch marks on your hips,   abdomen, and breasts. These are normal changes in the body during pregnancy. There are no exercises or medications to take which prevent this change.   Constipation may be treated with a stool softener or adding bulk to your diet. Drinking lots of fluids, fiber in vegetables, fruits, and whole grains are helpful.   Exercising is also helpful. If you have been very active up until your pregnancy, most of these activities can be continued during your pregnancy. If you have been less active, it is helpful  to start an exercise program such as walking. Consult your caregiver before starting exercise programs.   Avoid all smoking, alcohol, un-prescribed drugs, herbs and "street drugs" during your pregnancy. These chemicals affect the formation and growth of the baby. Avoid chemicals throughout the pregnancy to ensure the delivery of a healthy infant.   Backache, varicose veins and hemorrhoids may develop or get worse.   You will tire more easily in the third trimester, which is normal.   The baby's movements may be stronger and more often.   You may become short of breath easily.   Your belly button may stick out.   A yellow discharge may leak from your breasts called colostrum.   You may have a bloody mucus discharge. This usually occurs a few days to a week before labor begins.  HOME CARE INSTRUCTIONS   Keep your caregiver's appointments. Follow your caregiver's instructions regarding medication use, exercise, and diet.   During pregnancy, you are providing food for you and your baby. Continue to eat regular, well-balanced meals. Choose foods such as meat, fish, milk and other low fat dairy products, vegetables, fruits, and whole-grain breads and cereals. Your caregiver will tell you of the ideal weight gain.   A physical sexual relationship may be continued throughout pregnancy if there are no other problems such as early (premature) leaking of amniotic fluid from the membranes, vaginal bleeding, or belly (abdominal) pain.   Exercise regularly if there are no restrictions. Check with your caregiver if you are unsure of the safety of your exercises. Greater weight gain will occur in the last 2 trimesters of pregnancy. Exercising helps:   Control your weight.   Get you in shape for labor and delivery.   You lose weight after you deliver.   Rest a lot with legs elevated, or as needed for leg cramps or low back pain.   Wear a good support or jogging bra for breast tenderness during  pregnancy. This may help if worn during sleep. Pads or tissues may be used in the bra if you are leaking colostrum.   Do not use hot tubs, steam rooms, or saunas.   Wear your seat belt when driving. This protects you and your baby if you are in an accident.   Avoid raw meat, cat litter boxes and soil used by cats. These carry germs that can cause birth defects in the baby.   It is easier to loose urine during pregnancy. Tightening up and strengthening the pelvic muscles will help with this problem. You can practice stopping your urination while you are going to the bathroom. These are the same muscles you need to strengthen. It is also the muscles you would use if you were trying to stop from passing gas. You can practice tightening these muscles up 10 times a set and repeating this about 3 times per day. Once you know what muscles to tighten up, do not perform these exercises during urination. It is more likely   to cause an infection by backing up the urine.   Ask for help if you have financial, counseling or nutritional needs during pregnancy. Your caregiver will be able to offer counseling for these needs as well as refer you for other special needs.   Make a list of emergency phone numbers and have them available.   Plan on getting help from family or friends when you go home from the hospital.   Make a trial run to the hospital.   Take prenatal classes with the father to understand, practice and ask questions about the labor and delivery.   Prepare the baby's room/nursery.   Do not travel out of the city unless it is absolutely necessary and with the advice of your caregiver.   Wear only low or no heal shoes to have better balance and prevent falling.  MEDICATIONS AND DRUG USE IN PREGNANCY  Take prenatal vitamins as directed. The vitamin should contain 1 milligram of folic acid. Keep all vitamins out of reach of children. Only a couple vitamins or tablets containing iron may be fatal  to a baby or young child when ingested.   Avoid use of all medications, including herbs, over-the-counter medications, not prescribed or suggested by your caregiver. Only take over-the-counter or prescription medicines for pain, discomfort, or fever as directed by your caregiver. Do not use aspirin, ibuprofen (Motrin, Advil, Nuprin) or naproxen (Aleve) unless OK'd by your caregiver.   Let your caregiver also know about herbs you may be using.   Alcohol is related to a number of birth defects. This includes fetal alcohol syndrome. All alcohol, in any form, should be avoided completely. Smoking will cause low birth rate and premature babies.   Street/illegal drugs are very harmful to the baby. They are absolutely forbidden. A baby born to an addicted mother will be addicted at birth. The baby will go through the same withdrawal an adult does.  SEEK MEDICAL CARE IF: You have any concerns or worries during your pregnancy. It is better to call with your questions if you feel they cannot wait, rather than worry about them. DECISIONS ABOUT CIRCUMCISION You may or may not know the sex of your baby. If you know your baby is a boy, it may be time to think about circumcision. Circumcision is the removal of the foreskin of the penis. This is the skin that covers the sensitive end of the penis. There is no proven medical need for this. Often this decision is made on what is popular at the time or based upon religious beliefs and social issues. You can discuss these issues with your caregiver or pediatrician. SEEK IMMEDIATE MEDICAL CARE IF:   An unexplained oral temperature above 102 F (38.9 C) develops, or as your caregiver suggests.   You have leaking of fluid from the vagina (birth canal). If leaking membranes are suspected, take your temperature and tell your caregiver of this when you call.   There is vaginal spotting, bleeding or passing clots. Tell your caregiver of the amount and how many pads are  used.   You develop a bad smelling vaginal discharge with a change in the color from clear to white.   You develop vomiting that lasts more than 24 hours.   You develop chills or fever.   You develop shortness of breath.   You develop burning on urination.   You loose more than 2 pounds of weight or gain more than 2 pounds of weight or as suggested by your   caregiver.   You notice sudden swelling of your face, hands, and feet or legs.   You develop belly (abdominal) pain. Round ligament discomfort is a common non-cancerous (benign) cause of abdominal pain in pregnancy. Your caregiver still must evaluate you.   You develop a severe headache that does not go away.   You develop visual problems, blurred or double vision.   If you have not felt your baby move for more than 1 hour. If you think the baby is not moving as much as usual, eat something with sugar in it and lie down on your left side for an hour. The baby should move at least 4 to 5 times per hour. Call right away if your baby moves less than that.   You fall, are in a car accident or any kind of trauma.   There is mental or physical violence at home.  Document Released: 07/06/2001 Document Revised: 07/01/2011 Document Reviewed: 01/08/2009 [employer] Patient Information 2012 [employer]. Contraception Choices Contraception (birth control) is the use of any methods or devices to prevent pregnancy. Below are some methods to help avoid pregnancy. HORMONAL METHODS   Contraceptive implant. This is a thin, plastic tube containing progesterone hormone. It does not contain estrogen hormone. Your caregiver inserts the tube in the inner part of the upper arm. The tube can remain in place for up to 3 years. After 3 years, the implant must be removed. The implant prevents the ovaries from releasing an egg (ovulation), thickens the cervical mucus which prevents sperm from entering the uterus, and thins the lining of the inside of the  uterus.   Progesterone-only injections. These injections are given every 3 months by your caregiver to prevent pregnancy. This synthetic progesterone hormone stops the ovaries from releasing eggs. It also thickens cervical mucus and changes the uterine lining. This makes it harder for sperm to survive in the uterus.   Birth control pills. These pills contain estrogen and progesterone hormone. They work by stopping the egg from forming in the ovary (ovulation). Birth control pills are prescribed by a caregiver.Birth control pills can also be used to treat heavy periods.   Minipill. This type of birth control pill contains only the progesterone hormone. They are taken every day of each month and must be prescribed by your caregiver.   Birth control patch. The patch contains hormones similar to those in birth control pills. It must be changed once a week and is prescribed by a caregiver.   Vaginal ring. The ring contains hormones similar to those in birth control pills. It is left in the vagina for 3 weeks, removed for 1 week, and then a new one is put back in place. The patient must be comfortable inserting and removing the ring from the vagina.A caregiver's prescription is necessary.   Emergency contraception. Emergency contraceptives prevent pregnancy after unprotected sexual intercourse. This pill can be taken right after sex or up to 5 days after unprotected sex. It is most effective the sooner you take the pills after having sexual intercourse. Emergency contraceptive pills are available without a prescription. Check with your pharmacist. Do not use emergency contraception as your only form of birth control.  BARRIER METHODS   Female condom. This is a thin sheath (latex or rubber) that is worn over the penis during sexual intercourse. It can be used with spermicide to increase effectiveness.   Female condom. This is a soft, loose-fitting sheath that is put into the vagina before sexual  intercourse.     Diaphragm. This is a soft, latex, dome-shaped barrier that must be fitted by a caregiver. It is inserted into the vagina, along with a spermicidal jelly. It is inserted before intercourse. The diaphragm should be left in the vagina for 6 to 8 hours after intercourse.   Cervical cap. This is a round, soft, latex or plastic cup that fits over the cervix and must be fitted by a caregiver. The cap can be left in place for up to 48 hours after intercourse.   Sponge. This is a soft, circular piece of polyurethane foam. The sponge has spermicide in it. It is inserted into the vagina after wetting it and before sexual intercourse.   Spermicides. These are chemicals that kill or block sperm from entering the cervix and uterus. They come in the form of creams, jellies, suppositories, foam, or tablets. They do not require a prescription. They are inserted into the vagina with an applicator before having sexual intercourse. The process must be repeated every time you have sexual intercourse.  INTRAUTERINE CONTRACEPTION  Intrauterine device (IUD). This is a T-shaped device that is put in a woman's uterus during a menstrual period to prevent pregnancy. There are 2 types:   Copper IUD. This type of IUD is wrapped in copper wire and is placed inside the uterus. Copper makes the uterus and fallopian tubes produce a fluid that kills sperm. It can stay in place for 10 years.   Hormone IUD. This type of IUD contains the hormone progestin (synthetic progesterone). The hormone thickens the cervical mucus and prevents sperm from entering the uterus, and it also thins the uterine lining to prevent implantation of a fertilized egg. The hormone can weaken or kill the sperm that get into the uterus. It can stay in place for 5 years.  PERMANENT METHODS OF CONTRACEPTION  Female tubal ligation. This is when the woman's fallopian tubes are surgically sealed, tied, or blocked to prevent the egg from traveling to  the uterus.   Female sterilization. This is when the female has the tubes that carry sperm tied off (vasectomy).This blocks sperm from entering the vagina during sexual intercourse. After the procedure, the man can still ejaculate fluid (semen).  NATURAL PLANNING METHODS  Natural family planning. This is not having sexual intercourse or using a barrier method (condom, diaphragm, cervical cap) on days the woman could become pregnant.   Calendar method. This is keeping track of the length of each menstrual cycle and identifying when you are fertile.   Ovulation method. This is avoiding sexual intercourse during ovulation.   Symptothermal method. This is avoiding sexual intercourse during ovulation, using a thermometer and ovulation symptoms.   Post-ovulation method. This is timing sexual intercourse after you have ovulated.  Regardless of which type or method of contraception you choose, it is important that you use condoms to protect against the transmission of sexually transmitted diseases (STDs). Talk with your caregiver about which form of contraception is most appropriate for you. Document Released: 07/12/2005 Document Revised: 07/01/2011 Document Reviewed: 11/18/2010 [employer] Patient Information 2012 [employer]. Breastfeeding BENEFITS OF BREASTFEEDING For the baby  The first milk (colostrum) helps the baby's digestive system function better.   There are antibodies from the mother in the milk that help the baby fight off infections.   The baby has a lower incidence of asthma, allergies, and SIDS (sudden infant death syndrome).   The nutrients in breast milk are better than formulas for the baby and helps the baby's brain grow better.     Babies who breastfeed have less gas, colic, and constipation.  For the mother  Breastfeeding helps develop a very special bond between mother and baby.   It is more convenient, always available at the correct temperature and cheaper than formula  feeding.   It burns calories in the mother and helps with losing weight that was gained during pregnancy.   It makes the uterus contract back down to normal size faster and slows bleeding following delivery.   Breastfeeding mothers have a lower risk of developing breast cancer.  NURSE FREQUENTLY  A healthy, full-term baby may breastfeed as often as every hour or space his or her feedings to every 3 hours.   How often to nurse will vary from baby to baby. Watch your baby for signs of hunger, not the clock.   Nurse as often as the baby requests, or when you feel the need to reduce the fullness of your breasts.   Awaken the baby if it has been 3 to 4 hours since the last feeding.   Frequent feeding will help the mother make more milk and will prevent problems like sore nipples and engorgement of the breasts.  BABY'S POSITION AT THE BREAST  Whether lying down or sitting, be sure that the baby's tummy is facing your tummy.   Support the breast with 4 fingers underneath the breast and the thumb above. Make sure your fingers are well away from the nipple and baby's mouth.   Stroke the baby's lips and cheek closest to the breast gently with your finger or nipple.   When the baby's mouth is open wide enough, place all of your nipple and as much of the dark area around the nipple as possible into your baby's mouth.   Pull the baby in close so the tip of the nose and the baby's cheeks touch the breast during the feeding.  FEEDINGS  The length of each feeding varies from baby to baby and from feeding to feeding.   The baby must suck about 2 to 3 minutes for your milk to get to him or her. This is called a "let down." For this reason, allow the baby to feed on each breast as long as he or she wants. Your baby will end the feeding when he or she has received the right balance of nutrients.   To break the suction, put your finger into the corner of the baby's mouth and slide it between his or her  gums before removing your breast from his or her mouth. This will help prevent sore nipples.  REDUCING BREAST ENGORGEMENT  In the first week after your baby is born, you may experience signs of breast engorgement. When breasts are engorged, they feel heavy, warm, full, and may be tender to the touch. You can reduce engorgement if you:   Nurse frequently, every 2 to 3 hours. Mothers who breastfeed early and often have fewer problems with engorgement.   Place light ice packs on your breasts between feedings. This reduces swelling. Wrap the ice packs in a lightweight towel to protect your skin.   Apply moist hot packs to your breast for 5 to 10 minutes before each feeding. This increases circulation and helps the milk flow.   Gently massage your breast before and during the feeding.   Make sure that the baby empties at least one breast at every feeding before switching sides.   Use a breast pump to empty the breasts if your baby is sleepy or   not nursing well. You may also want to pump if you are returning to work or or you feel you are getting engorged.   Avoid bottle feeds, pacifiers or supplemental feedings of water or juice in place of breastfeeding.   Be sure the baby is latched on and positioned properly while breastfeeding.   Prevent fatigue, stress, and anemia.   Wear a supportive bra, avoiding underwire styles.   Eat a balanced diet with enough fluids.  If you follow these suggestions, your engorgement should improve in 24 to 48 hours. If you are still experiencing difficulty, call your lactation consultant or caregiver. IS MY BABY GETTING ENOUGH MILK? Sometimes, mothers worry about whether their babies are getting enough milk. You can be assured that your baby is getting enough milk if:  The baby is actively sucking and you hear swallowing.   The baby nurses at least 8 to 12 times in a 24 hour time period. Nurse your baby until he or she unlatches or falls asleep at the first  breast (at least 10 to 20 minutes), then offer the second side.   The baby is wetting 5 to 6 disposable diapers (6 to 8 cloth diapers) in a 24 hour period by 5 to 6 days of age.   The baby is having at least 2 to 3 stools every 24 hours for the first few months. Breast milk is all the food your baby needs. It is not necessary for your baby to have water or formula. In fact, to help your breasts make more milk, it is best not to give your baby supplemental feedings during the early weeks.   The stool should be soft and yellow.   The baby should gain 4 to 7 ounces per week after he is 4 days old.  TAKE CARE OF YOURSELF Take care of your breasts by:  Bathing or showering daily.   Avoiding the use of soaps on your nipples.   Start feedings on your left breast at one feeding and on your right breast at the next feeding.   You will notice an increase in your milk supply 2 to 5 days after delivery. You may feel some discomfort from engorgement, which makes your breasts very firm and often tender. Engorgement "peaks" out within 24 to 48 hours. In the meantime, apply warm moist towels to your breasts for 5 to 10 minutes before feeding. Gentle massage and expression of some milk before feeding will soften your breasts, making it easier for your baby to latch on. Wear a well fitting nursing bra and air dry your nipples for 10 to 15 minutes after each feeding.   Only use cotton bra pads.   Only use pure lanolin on your nipples after nursing. You do not need to wash it off before nursing.  Take care of yourself by:   Eating well-balanced meals and nutritious snacks.   Drinking milk, fruit juice, and water to satisfy your thirst (about 8 glasses a day).   Getting plenty of rest.   Increasing calcium in your diet (1200 mg a day).   Avoiding foods that you notice affect the baby in a bad way.  SEEK MEDICAL CARE IF:   You have any questions or difficulty with breastfeeding.   You need help.    You have a hard, red, sore area on your breast, accompanied by a fever of 100.5 F (38.1 C) or more.   Your baby is too sleepy to eat well or is having   trouble sleeping.   Your baby is wetting less than 6 diapers per day, by 5 days of age.   Your baby's skin or white part of his or her eyes is more yellow than it was in the hospital.   You feel depressed.  Document Released: 07/12/2005 Document Revised: 07/01/2011 Document Reviewed: 02/24/2009 [employer] Patient Information 2012 [employer]. 

## 2011-11-02 NOTE — Progress Notes (Signed)
Glucola, Rhogam and 17 P today

## 2011-11-02 NOTE — Progress Notes (Incomplete)
Cervix is soft and anterior.--To [facility] for w/u

## 2011-11-02 NOTE — Progress Notes (Signed)
Doing well, having occasional stomach cramps in the evening.  Patient is here for gtt today and will receive her 17-P injection.  She has had some yellowish discharge that is occasionally itchy.

## 2011-11-02 NOTE — Discharge Instructions (Signed)
Preterm Labor Preterm labor is when labor starts at less than 37 weeks of pregnancy. The normal length of a pregnancy is 39 to 41 weeks. CAUSES Often, there is no identifiable underlying cause as to why a woman goes into preterm labor. However, one of the most common known causes of preterm labor is infection. Infections of the uterus, cervix, vagina, amniotic sac, bladder, kidney, or even the lungs (pneumonia) can cause labor to start. Other causes of preterm labor include:  Urogenital infections, such as yeast infections and bacterial vaginosis.   Uterine abnormalities (uterine shape, uterine septum, fibroids, bleeding from the placenta).   A cervix that has been operated on and opens prematurely.   Malformations in the baby.   Multiple gestations (twins, triplets, and so on).   Breakage of the amniotic sac.  Additional risk factors for preterm labor include:  Previous history of preterm labor.   Premature rupture of membranes (PROM).   A placenta that covers the opening of the cervix (placenta previa).   A placenta that separates from the uterus (placenta abruption).   A cervix that is too weak to hold the baby in the uterus (incompetence cervix).   Having too much fluid in the amniotic sac (polyhydramnios).   Taking illegal drugs or smoking while pregnant.   Not gaining enough weight while pregnant.   Women younger than 38 and older than 32 years old.   Low socioeconomic status.   African-American ethnicity.  SYMPTOMS Signs and symptoms of preterm labor include:  Menstrual-like cramps.   Contractions that are 30 to 70 seconds apart, become very regular, closer together, and are more intense and painful.   Contractions that start on the top of the uterus and spread down to the lower abdomen and back.   A sense of increased pelvic pressure or back pain.   A watery or bloody discharge that comes from the vagina.  DIAGNOSIS  A diagnosis can be confirmed by:  A  vaginal exam.   An ultrasound of the cervix.   Sampling (swabbing) cervico-vaginal secretions. These samples can be tested for the presence of fetal fibronectin. This is a protein found in cervical discharge which is associated with preterm labor.   Fetal monitoring.  TREATMENT  Depending on the length of the pregnancy and other circumstances, a caregiver may suggest bed rest. If necessary, there are medicines that can be given to stop contractions and to quicken fetal lung maturity. If labor happens before 34 weeks of pregnancy, a prolonged hospital stay may be recommended. Treatment depends on the condition of both the mother and baby. PREVENTION There are some things a mother can do to lower the risk of preterm labor in future pregnancies. A woman can:   Stop smoking.   Maintain healthy weight gain and avoid chemicals and drugs that are not necessary.   Be watchful for any type of infection.   Inform her caregiver if she has a known history of preterm labor.  Document Released: 10/02/2003 Document Revised: 07/01/2011 Document Reviewed: 11/06/2010 [facility] Patient Information 2012 [place], [place].   Fetal Fibronectin This is a test done to help evaluate a pregnant woman's risk of pre-term delivery. It is generally done when you are 26 to [redacted] weeks pregnant and are having symptoms of premature labor. A Dacron swab is used to take a sample of cervical or vaginal fluid from the back portion of the vagina or from the area just outside the opening of the cervix. Fetal fibronectin (fFN) is a  glycoprotein that can be used to help predict the short term risk of premature delivery. fFN is produced at the boundary between the amniotic sac and the lining of the mother's uterus. This is called the unteroplacental junction. Fetal fibronectin is largely confined to this junction and thought to help maintain the integrity of the boundary. fFN is normally detectable in cervicovaginal fluid during the  first 20 to 24 weeks of pregnancy, and then is detectable again after about 36 weeks.  Finding fFN in cervicovaginal fluids after 36 weeks is not unusual as it is often released by the body as it gets ready for childbirth. The elevated fFN found in vaginal fluids early in pregnancy may simply reflect the normal growth and establishment of tissues at the unteroplacental junction with levels falling when this phase is complete. What is known is that fFN that is detected between 24 and 36 weeks of pregnancy is not normal. Elevated levels reflect a disturbance at the uteroplacental junction and have been associated with an increased risk of pre-term labor and delivery. Knowing whether or not a woman is likely to deliver prematurely helps your caregiver plan a course of action. The fFN test is a relatively non-invasive tool to help the caregiver to distinguish between those who are likely to deliver shortly and those who are not.  PREPARATION FOR TEST   Inform the person conducting the test if you have a medical condition or are using any medications that cause excessive bleeding.   Do not have sexual intercourse for 24 hours before the procedure.  NORMAL FINDINGS  Pregnancy = 50 nanograms/ml Ranges for normal findings may vary among different laboratories and hospitals. You should always check with your doctor after having lab work or other tests done to discuss the meaning of your test results and whether your values are considered within normal limits. MEANING OF TEST  Your caregiver will go over the test results with you and discuss the importance and meaning of your results, as well as treatment options and the need for additional tests if necessary. OBTAINING THE TEST RESULTS  It is your responsibility to obtain your test results. Ask the lab or department performing the test when and how you will get your results. Document Released: 05/13/2004 Document Revised: 07/01/2011 Document Reviewed:  06/21/2008 [facility] Patient Information 2012 [place], [place].

## 2011-11-03 ENCOUNTER — Inpatient Hospital Stay (HOSPITAL_COMMUNITY)
Admission: AD | Admit: 2011-11-03 | Discharge: 2011-11-03 | Disposition: A | Discharge status: Home / Self Care | Payer: BC Managed Care – PPO | Attending: Obstetrics & Gynecology | Admitting: Obstetrics & Gynecology

## 2011-11-03 ENCOUNTER — Inpatient Hospital Stay (HOSPITAL_COMMUNITY)
Admission: AD | Admit: 2011-11-03 | Discharge: 2011-11-03 | Disposition: A | Discharge status: Home / Self Care | Payer: BC Managed Care – PPO | Source: Ambulatory Visit | Attending: Obstetrics and Gynecology | Admitting: Obstetrics and Gynecology

## 2011-11-03 ENCOUNTER — Encounter (HOSPITAL_COMMUNITY): Payer: Self-pay | Admitting: *Deleted

## 2011-11-03 DIAGNOSIS — O0990 Supervision of high risk pregnancy, unspecified, unspecified trimester: Secondary | ICD-10-CM

## 2011-11-03 DIAGNOSIS — O34219 Maternal care for unspecified type scar from previous cesarean delivery: Secondary | ICD-10-CM

## 2011-11-03 DIAGNOSIS — O479 False labor, unspecified: Secondary | ICD-10-CM

## 2011-11-03 DIAGNOSIS — O09899 Supervision of other high risk pregnancies, unspecified trimester: Secondary | ICD-10-CM

## 2011-11-03 DIAGNOSIS — O4700 False labor before 37 completed weeks of gestation, unspecified trimester: Secondary | ICD-10-CM | POA: Insufficient documentation

## 2011-11-03 DIAGNOSIS — R109 Unspecified abdominal pain: Secondary | ICD-10-CM | POA: Insufficient documentation

## 2011-11-03 LAB — CBC WITH DIFFERENTIAL/PLATELET
Eosinophils Relative: 4 % (ref 0–5)
Lymphocytes Relative: 27 % (ref 12–46)
Lymphs Abs: 2.2 10*3/uL (ref 0.7–4.0)
MCV: 95.9 fL (ref 78.0–100.0)
Neutrophils Relative %: 65 % (ref 43–77)
Platelets: 240 10*3/uL (ref 150–400)
RBC: 3.9 MIL/uL (ref 3.87–5.11)
WBC: 8.4 10*3/uL (ref 4.0–10.5)

## 2011-11-03 LAB — ANTIBODY SCREEN: Antibody Screen: NEGATIVE

## 2011-11-03 LAB — HIV ANTIBODY (ROUTINE TESTING W REFLEX): HIV: NONREACTIVE

## 2011-11-03 MED ORDER — BETAMETHASONE SOD PHOS & ACET 6 (3-3) MG/ML IJ SUSP
12.0000 mg | Freq: Once | INTRAMUSCULAR | Status: AC
  Administered 2011-11-03: 12 mg via INTRAMUSCULAR
  Filled 2011-11-03: qty 2

## 2011-11-03 NOTE — MAU Provider Note (Signed)
   Alexandra Douglas is  31 y.o. G2P0102 at [redacted]w[redacted]d presents complaining of Follow-up Here for 2nd BMZ. Seen here yesterday for PT UCs and again at 0200 today. Had VE then and cx L/C. Now no c/o pain or bleeding. Good FM.   Obstetrical/Gynecological History: OB History    Grav Para Term Preterm Abortions TAB SAB Ect Mult Living   2 1 0 1 0 0 0 0 1 2       Past Medical History: Past Medical History  Diagnosis Date  . PONV (postoperative nausea and vomiting)   . Preterm labor     Past Surgical History: Past Surgical History  Procedure Date  . Cesarean section 02/06/2008  . Wisdom tooth extraction     Family History: Family History  Problem Relation Age of Onset  . Arthritis Mother   . Hyperlipidemia Mother   . Heart disease Maternal Grandfather   . Anesthesia problems Neg Hx     Social History: History  Substance Use Topics  . Smoking status: Never Smoker   . Smokeless tobacco: Never Used  . Alcohol Use: No    Allergies: No Known Allergies  Prescriptions prior to admission  Medication Sig Dispense Refill  . Prenatal Vit-Fe Fumarate-FA (MULTIVITAMIN-PRENATAL) 27-0.8 MG TABS Take 1 tablet by mouth daily.           Physical Exam   Blood pressure 104/61, pulse 80, temperature 98.4 F (36.9 C), temperature source Oral, resp. rate 18, height 5\' 3"  (1.6 m), weight 88.451 kg (195 lb), last menstrual period 04/21/2011.  Exam deferred Assessment: Alexandra Douglas is  31 y.o. G2P0102 at [redacted]w[redacted]d presents for BMZ injection #2.Marland Kitchen  Plan: BMZ given  Keep appt at [facility] tomorrow for fFN>   Alexandra Douglas,[id number]:24 PM

## 2011-11-03 NOTE — Discharge Instructions (Signed)

## 2011-11-03 NOTE — MAU Provider Note (Signed)
  History     CSN: [id number]  Arrival date and time: 11/03/11 0155   None     Chief Complaint  Patient presents with  . Contractions   HPI Pt is a 30yo MWF [id number] who presents for eval of occ cramping at home after being d/c approx 5-6 hours ago from MAU. Denies leak or bldg. Reports +FM. Cannot feel the start and stop of ctx; just vague crampy sensation. While in MAU she rec'd a cx length eval of 3.1cm and a single BMZ inj. She was to return to MAU this evening for a FFN and second BMZ inj. OB History    Grav Para Term Preterm Abortions TAB SAB Ect Mult Living   2 1 0 1 0 0 0 0 1 2       Past Medical History  Diagnosis Date  . PONV (postoperative nausea and vomiting)   . Preterm labor     Past Surgical History  Procedure Date  . Cesarean section 02/06/2008  . Wisdom tooth extraction     Family History  Problem Relation Age of Onset  . Arthritis Mother   . Hyperlipidemia Mother   . Heart disease Maternal Grandfather   . Anesthesia problems Neg Hx     History  Substance Use Topics  . Smoking status: Never Smoker   . Smokeless tobacco: Never Used  . Alcohol Use: No    Allergies: No Known Allergies  Prescriptions prior to admission  Medication Sig Dispense Refill  . Prenatal Vit-Fe Fumarate-FA (MULTIVITAMIN-PRENATAL) 27-0.8 MG TABS Take 1 tablet by mouth daily.         ROS Physical Exam   Blood pressure 115/67, pulse 94, temperature 98.6 F (37 C), temperature source Oral, resp. rate 18, height 5\' 3"  (1.6 m), weight 88.451 kg (195 lb), last menstrual period 04/21/2011.  Physical Exam  Constitutional: She is oriented to person, place, and time. She appears well-developed and well-nourished.  HENT:  Head: Normocephalic.  Cardiovascular: Normal rate.   Respiratory: Effort normal.  Musculoskeletal: Normal range of motion.  Neurological: She is alert and oriented to person, place, and time.  Skin: Skin is warm and dry.  Psychiatric: She has a normal mood  and affect. Her behavior is normal.  FHR reactive and reassuring; no ctx per toco Cx unchanged (ant/FT/thick/soft)  MAU Course  Procedures  MDM   Assessment and Plan  IUP at 28.0 Cramping without cx change  D/C home PTL precautions given Keep plan for 2nd BMZ tonight and to [facility] 4/11AM for FFN  Clelia Croft, [facility] 11/03/2011 3:23 AM

## 2011-11-03 NOTE — MAU Note (Signed)
Pt reports she was seen yesterday with preterm contractions, got meds and stopped them. Contractions returned about 2300, took tylenol without  Relief. Denies bleeding.

## 2011-11-03 NOTE — MAU Note (Signed)
Pt returns for betamethazone injection. Denies pain or contractions, denies bleeding

## 2011-11-04 ENCOUNTER — Ambulatory Visit (INDEPENDENT_AMBULATORY_CARE_PROVIDER_SITE_OTHER): Payer: BC Managed Care – PPO | Admitting: Obstetrics & Gynecology

## 2011-11-04 VITALS — BP 105/68 | Wt 196.0 lb

## 2011-11-04 DIAGNOSIS — O360990 Maternal care for other rhesus isoimmunization, unspecified trimester, not applicable or unspecified: Secondary | ICD-10-CM

## 2011-11-04 DIAGNOSIS — O34219 Maternal care for unspecified type scar from previous cesarean delivery: Secondary | ICD-10-CM

## 2011-11-04 DIAGNOSIS — O26899 Other specified pregnancy related conditions, unspecified trimester: Secondary | ICD-10-CM

## 2011-11-04 DIAGNOSIS — O0990 Supervision of high risk pregnancy, unspecified, unspecified trimester: Secondary | ICD-10-CM

## 2011-11-04 DIAGNOSIS — O09219 Supervision of pregnancy with history of pre-term labor, unspecified trimester: Secondary | ICD-10-CM

## 2011-11-04 NOTE — Progress Notes (Signed)
Patient has her routine prenatal visit opn 11/02/11 and was noted to have a soft ans anterior cervix on exam.  She was sent to MAU for further evaluation.  She was noted to have contractions, Procardia given to patient.  Cervical length 3.1cm; and on exam it was soft/closed/long.  She also received betamethasone on 11/02/11 and 11/03/11; the plan was for her to return here today for FFN. FFN done today, cervix noted to be soft/closed/long on speculum exam.  Will follow up results and manage accordingly. No other complaints or concerns.  Strict fetal movement and labor precautions reviewed.

## 2011-11-04 NOTE — Patient Instructions (Addendum)
Preterm Labor Preterm labor is when labor starts at less than 37 weeks of pregnancy. The normal length of a pregnancy is 39 to 41 weeks. CAUSES Often, there is no identifiable underlying cause as to why a woman goes into preterm labor. However, one of the most common known causes of preterm labor is infection. Infections of the uterus, cervix, vagina, amniotic sac, bladder, kidney, or even the lungs (pneumonia) can cause labor to start. Other causes of preterm labor include:  Urogenital infections, such as yeast infections and bacterial vaginosis.   Uterine abnormalities (uterine shape, uterine septum, fibroids, bleeding from the placenta).   A cervix that has been operated on and opens prematurely.   Malformations in the baby.   Multiple gestations (twins, triplets, and so on).   Breakage of the amniotic sac.  Additional risk factors for preterm labor include:  Previous history of preterm labor.   Premature rupture of membranes (PROM).   A placenta that covers the opening of the cervix (placenta previa).   A placenta that separates from the uterus (placenta abruption).   A cervix that is too weak to hold the baby in the uterus (incompetence cervix).   Having too much fluid in the amniotic sac (polyhydramnios).   Taking illegal drugs or smoking while pregnant.   Not gaining enough weight while pregnant.   Women younger than 8 and older than 32 years old.   Low socioeconomic status.   African-American ethnicity.  SYMPTOMS Signs and symptoms of preterm labor include:  Menstrual-like cramps.   Contractions that are 30 to 70 seconds apart, become very regular, closer together, and are more intense and painful.   Contractions that start on the top of the uterus and spread down to the lower abdomen and back.   A sense of increased pelvic pressure or back pain.   A watery or bloody discharge that comes from the vagina.  DIAGNOSIS  A diagnosis can be confirmed by:  A  vaginal exam.   An ultrasound of the cervix.   Sampling (swabbing) cervico-vaginal secretions. These samples can be tested for the presence of fetal fibronectin. This is a protein found in cervical discharge which is associated with preterm labor.   Fetal monitoring.  TREATMENT  Depending on the length of the pregnancy and other circumstances, a caregiver may suggest bed rest. If necessary, there are medicines that can be given to stop contractions and to quicken fetal lung maturity. If labor happens before 34 weeks of pregnancy, a prolonged hospital stay may be recommended. Treatment depends on the condition of both the mother and baby. PREVENTION There are some things a mother can do to lower the risk of preterm labor in future pregnancies. A woman can:   Stop smoking.   Maintain healthy weight gain and avoid chemicals and drugs that are not necessary.   Be watchful for any type of infection.   Inform her caregiver if she has a known history of preterm labor.  Document Released: 10/02/2003 Document Revised: 07/01/2011 Document Reviewed: 11/06/2010 [facility] Patient Information 2012 [place], [place].  Fetal Fibronectin This is a test done to help evaluate a pregnant woman's risk of pre-term delivery. It is generally done when you are 26 to [redacted] weeks pregnant and are having symptoms of premature labor. A Dacron swab is used to take a sample of cervical or vaginal fluid from the back portion of the vagina or from the area just outside the opening of the cervix. Fetal fibronectin (fFN) is a glycoprotein  that can be used to help predict the short term risk of premature delivery. fFN is produced at the boundary between the amniotic sac and the lining of the mother's uterus. This is called the unteroplacental junction. Fetal fibronectin is largely confined to this junction and thought to help maintain the integrity of the boundary. fFN is normally detectable in cervicovaginal fluid during the first  20 to 24 weeks of pregnancy, and then is detectable again after about 36 weeks.  Finding fFN in cervicovaginal fluids after 36 weeks is not unusual as it is often released by the body as it gets ready for childbirth. The elevated fFN found in vaginal fluids early in pregnancy may simply reflect the normal growth and establishment of tissues at the unteroplacental junction with levels falling when this phase is complete. What is known is that fFN that is detected between 24 and 36 weeks of pregnancy is not normal. Elevated levels reflect a disturbance at the uteroplacental junction and have been associated with an increased risk of pre-term labor and delivery. Knowing whether or not a woman is likely to deliver prematurely helps your caregiver plan a course of action. The fFN test is a relatively non-invasive tool to help the caregiver to distinguish between those who are likely to deliver shortly and those who are not.  PREPARATION FOR TEST   Inform the person conducting the test if you have a medical condition or are using any medications that cause excessive bleeding.   Do not have sexual intercourse for 24 hours before the procedure.  NORMAL FINDINGS  Pregnancy = 50 nanograms/ml Ranges for normal findings may vary among different laboratories and hospitals. You should always check with your doctor after having lab work or other tests done to discuss the meaning of your test results and whether your values are considered within normal limits. MEANING OF TEST  Your caregiver will go over the test results with you and discuss the importance and meaning of your results, as well as treatment options and the need for additional tests if necessary. OBTAINING THE TEST RESULTS  It is your responsibility to obtain your test results. Ask the lab or department performing the test when and how you will get your results. Document Released: 05/13/2004 Document Revised: 07/01/2011 Document Reviewed:  06/21/2008 [facility] Patient Information 2012 [place], [place].  Breastfeeding BENEFITS OF BREASTFEEDING For the baby  The first milk (colostrum) helps the baby's digestive system function better.   There are antibodies from the mother in the milk that help the baby fight off infections.   The baby has a lower incidence of asthma, allergies, and SIDS (sudden infant death syndrome).   The nutrients in breast milk are better than formulas for the baby and helps the baby's brain grow better.   Babies who breastfeed have less gas, colic, and constipation.  For the mother  Breastfeeding helps develop a very special bond between mother and baby.   It is more convenient, always available at the correct temperature and cheaper than formula feeding.   It burns calories in the mother and helps with losing weight that was gained during pregnancy.   It makes the uterus contract back down to normal size faster and slows bleeding following delivery.   Breastfeeding mothers have a lower risk of developing breast cancer.  NURSE FREQUENTLY  A healthy, full-term baby may breastfeed as often as every hour or space his or her feedings to every 3 hours.   How often to nurse will vary  from baby to baby. Watch your baby for signs of hunger, not the clock.   Nurse as often as the baby requests, or when you feel the need to reduce the fullness of your breasts.   Awaken the baby if it has been 3 to 4 hours since the last feeding.   Frequent feeding will help the mother make more milk and will prevent problems like sore nipples and engorgement of the breasts.  BABY'S POSITION AT THE BREAST  Whether lying down or sitting, be sure that the baby's tummy is facing your tummy.   Support the breast with 4 fingers underneath the breast and the thumb above. Make sure your fingers are well away from the nipple and baby's mouth.   Stroke the baby's lips and cheek closest to the breast gently with your finger or  nipple.   When the baby's mouth is open wide enough, place all of your nipple and as much of the dark area around the nipple as possible into your baby's mouth.   Pull the baby in close so the tip of the nose and the baby's cheeks touch the breast during the feeding.  FEEDINGS  The length of each feeding varies from baby to baby and from feeding to feeding.   The baby must suck about 2 to 3 minutes for your milk to get to him or her. This is called a "let down." For this reason, allow the baby to feed on each breast as long as he or she wants. Your baby will end the feeding when he or she has received the right balance of nutrients.   To break the suction, put your finger into the corner of the baby's mouth and slide it between his or her gums before removing your breast from his or her mouth. This will help prevent sore nipples.  REDUCING BREAST ENGORGEMENT  In the first week after your baby is born, you may experience signs of breast engorgement. When breasts are engorged, they feel heavy, warm, full, and may be tender to the touch. You can reduce engorgement if you:   Nurse frequently, every 2 to 3 hours. Mothers who breastfeed early and often have fewer problems with engorgement.   Place light ice packs on your breasts between feedings. This reduces swelling. Wrap the ice packs in a lightweight towel to protect your skin.   Apply moist hot packs to your breast for 5 to 10 minutes before each feeding. This increases circulation and helps the milk flow.   Gently massage your breast before and during the feeding.   Make sure that the baby empties at least one breast at every feeding before switching sides.   Use a breast pump to empty the breasts if your baby is sleepy or not nursing well. You may also want to pump if you are returning to work or or you feel you are getting engorged.   Avoid bottle feeds, pacifiers or supplemental feedings of water or juice in place of breastfeeding.    Be sure the baby is latched on and positioned properly while breastfeeding.   Prevent fatigue, stress, and anemia.   Wear a supportive bra, avoiding underwire styles.   Eat a balanced diet with enough fluids.  If you follow these suggestions, your engorgement should improve in 24 to 48 hours. If you are still experiencing difficulty, call your lactation consultant or caregiver. IS MY BABY GETTING ENOUGH MILK? Sometimes, mothers worry about whether their babies are getting enough milk.  You can be assured that your baby is getting enough milk if:  The baby is actively sucking and you hear swallowing.   The baby nurses at least 8 to 12 times in a 24 hour time period. Nurse your baby until he or she unlatches or falls asleep at the first breast (at least 10 to 20 minutes), then offer the second side.   The baby is wetting 5 to 6 disposable diapers (6 to 8 cloth diapers) in a 24 hour period by 23 to 48 days of age.   The baby is having at least 2 to 3 stools every 24 hours for the first few months. Breast milk is all the food your baby needs. It is not necessary for your baby to have water or formula. In fact, to help your breasts make more milk, it is best not to give your baby supplemental feedings during the early weeks.   The stool should be soft and yellow.   The baby should gain 4 to 7 ounces per week after he is 70 days old.  TAKE CARE OF YOURSELF Take care of your breasts by:  Bathing or showering daily.   Avoiding the use of soaps on your nipples.   Start feedings on your left breast at one feeding and on your right breast at the next feeding.   You will notice an increase in your milk supply 2 to 5 days after delivery. You may feel some discomfort from engorgement, which makes your breasts very firm and often tender. Engorgement "peaks" out within 24 to 48 hours. In the meantime, apply warm moist towels to your breasts for 5 to 10 minutes before feeding. Gentle massage and  expression of some milk before feeding will soften your breasts, making it easier for your baby to latch on. Wear a well fitting nursing bra and air dry your nipples for 10 to 15 minutes after each feeding.   Only use cotton bra pads.   Only use pure lanolin on your nipples after nursing. You do not need to wash it off before nursing.  Take care of yourself by:   Eating well-balanced meals and nutritious snacks.   Drinking milk, fruit juice, and water to satisfy your thirst (about 8 glasses a day).   Getting plenty of rest.   Increasing calcium in your diet (1200 mg a day).   Avoiding foods that you notice affect the baby in a bad way.  SEEK MEDICAL CARE IF:   You have any questions or difficulty with breastfeeding.   You need help.   You have a hard, red, sore area on your breast, accompanied by a fever of 100.5 F (38.1 C) or more.   Your baby is too sleepy to eat well or is having trouble sleeping.   Your baby is wetting less than 6 diapers per day, by 27 days of age.   Your baby's skin or white part of his or her eyes is more yellow than it was in the hospital.   You feel depressed.  Document Released: 07/12/2005 Document Revised: 07/01/2011 Document Reviewed: 02/24/2009 [facility] Patient Information 2012 [place], [place].   Pregnancy - Third Trimester The third trimester of pregnancy (the last 3 months) is a period of the most rapid growth for you and your baby. The baby approaches a length of 20 inches and a weight of 6 to 10 pounds. The baby is adding on fat and getting ready for life outside your body. While inside, babies have  periods of sleeping and waking, suck their thumbs, and hiccups. You can often feel small contractions of the uterus. This is false labor. It is also called Braxton-Hicks contractions. This is like a practice for labor. The usual problems in this stage of pregnancy include more difficulty breathing, swelling of the hands and feet from water retention,  and having to urinate more often because of the uterus and baby pressing on your bladder.  PRENATAL EXAMS  Blood work may continue to be done during prenatal exams. These tests are done to check on your health and the probable health of your baby. Blood work is used to follow your blood levels (hemoglobin). Anemia (low hemoglobin) is common during pregnancy. Iron and vitamins are given to help prevent this. You may also continue to be checked for diabetes. Some of the past blood tests may be done again.   The size of the uterus is measured during each visit. This makes sure your baby is growing properly according to your pregnancy dates.   Your blood pressure is checked every prenatal visit. This is to make sure you are not getting toxemia.   Your urine is checked every prenatal visit for infection, diabetes and protein.   Your weight is checked at each visit. This is done to make sure gains are happening at the suggested rate and that you and your baby are growing normally.   Sometimes, an ultrasound is performed to confirm the position and the proper growth and development of the baby. This is a test done that bounces harmless sound waves off the baby so your caregiver can more accurately determine due dates.   Discuss the type of pain medication and anesthesia you will have during your labor and delivery.   Discuss the possibility and anesthesia if a Cesarean Section might be necessary.   Inform your caregiver if there is any mental or physical violence at home.  Sometimes, a specialized non-stress test, contraction stress test and biophysical profile are done to make sure the baby is not having a problem. Checking the amniotic fluid surrounding the baby is called an amniocentesis. The amniotic fluid is removed by sticking a needle into the belly (abdomen). This is sometimes done near the end of pregnancy if an early delivery is required. In this case, it is done to help make sure the baby's  lungs are mature enough for the baby to live outside of the womb. If the lungs are not mature and it is unsafe to deliver the baby, an injection of cortisone medication is given to the mother 1 to 2 days before the delivery. This helps the baby's lungs mature and makes it safer to deliver the baby. CHANGES OCCURING IN THE THIRD TRIMESTER OF PREGNANCY Your body goes through many changes during pregnancy. They vary from person to person. Talk to your caregiver about changes you notice and are concerned about.  During the last trimester, you have probably had an increase in your appetite. It is normal to have cravings for certain foods. This varies from person to person and pregnancy to pregnancy.   You may begin to get stretch marks on your hips, abdomen, and breasts. These are normal changes in the body during pregnancy. There are no exercises or medications to take which prevent this change.   Constipation may be treated with a stool softener or adding bulk to your diet. Drinking lots of fluids, fiber in vegetables, fruits, and whole grains are helpful.   Exercising is also helpful.  If you have been very active up until your pregnancy, most of these activities can be continued during your pregnancy. If you have been less active, it is helpful to start an exercise program such as walking. Consult your caregiver before starting exercise programs.   Avoid all smoking, alcohol, un-prescribed drugs, herbs and "street drugs" during your pregnancy. These chemicals affect the formation and growth of the baby. Avoid chemicals throughout the pregnancy to ensure the delivery of a healthy infant.   Backache, varicose veins and hemorrhoids may develop or get worse.   You will tire more easily in the third trimester, which is normal.   The baby's movements may be stronger and more often.   You may become short of breath easily.   Your belly button may stick out.   A yellow discharge may leak from your  breasts called colostrum.   You may have a bloody mucus discharge. This usually occurs a few days to a week before labor begins.  HOME CARE INSTRUCTIONS   Keep your caregiver's appointments. Follow your caregiver's instructions regarding medication use, exercise, and diet.   During pregnancy, you are providing food for you and your baby. Continue to eat regular, well-balanced meals. Choose foods such as meat, fish, milk and other low fat dairy products, vegetables, fruits, and whole-grain breads and cereals. Your caregiver will tell you of the ideal weight gain.   A physical sexual relationship may be continued throughout pregnancy if there are no other problems such as early (premature) leaking of amniotic fluid from the membranes, vaginal bleeding, or belly (abdominal) pain.   Exercise regularly if there are no restrictions. Check with your caregiver if you are unsure of the safety of your exercises. Greater weight gain will occur in the last 2 trimesters of pregnancy. Exercising helps:   Control your weight.   Get you in shape for labor and delivery.   You lose weight after you deliver.   Rest a lot with legs elevated, or as needed for leg cramps or low back pain.   Wear a good support or jogging bra for breast tenderness during pregnancy. This may help if worn during sleep. Pads or tissues may be used in the bra if you are leaking colostrum.   Do not use hot tubs, steam rooms, or saunas.   Wear your seat belt when driving. This protects you and your baby if you are in an accident.   Avoid raw meat, cat litter boxes and soil used by cats. These carry germs that can cause birth defects in the baby.   It is easier to loose urine during pregnancy. Tightening up and strengthening the pelvic muscles will help with this problem. You can practice stopping your urination while you are going to the bathroom. These are the same muscles you need to strengthen. It is also the muscles you would  use if you were trying to stop from passing gas. You can practice tightening these muscles up 10 times a set and repeating this about 3 times per day. Once you know what muscles to tighten up, do not perform these exercises during urination. It is more likely to cause an infection by backing up the urine.   Ask for help if you have financial, counseling or nutritional needs during pregnancy. Your caregiver will be able to offer counseling for these needs as well as refer you for other special needs.   Make a list of emergency phone numbers and have them available.  Plan on getting help from family or friends when you go home from the hospital.   Make a trial run to the hospital.   Take prenatal classes with the father to understand, practice and ask questions about the labor and delivery.   Prepare the baby's room/nursery.   Do not travel out of the city unless it is absolutely necessary and with the advice of your caregiver.   Wear only low or no heal shoes to have better balance and prevent falling.  MEDICATIONS AND DRUG USE IN PREGNANCY  Take prenatal vitamins as directed. The vitamin should contain 1 milligram of folic acid. Keep all vitamins out of reach of children. Only a couple vitamins or tablets containing iron may be fatal to a baby or young child when ingested.   Avoid use of all medications, including herbs, over-the-counter medications, not prescribed or suggested by your caregiver. Only take over-the-counter or prescription medicines for pain, discomfort, or fever as directed by your caregiver. Do not use aspirin, ibuprofen (Motrin, Advil, Nuprin) or naproxen (Aleve) unless OK'd by your caregiver.   Let your caregiver also know about herbs you may be using.   Alcohol is related to a number of birth defects. This includes fetal alcohol syndrome. All alcohol, in any form, should be avoided completely. Smoking will cause low birth rate and premature babies.    Street/illegal drugs are very harmful to the baby. They are absolutely forbidden. A baby born to an addicted mother will be addicted at birth. The baby will go through the same withdrawal an adult does.  SEEK MEDICAL CARE IF: You have any concerns or worries during your pregnancy. It is better to call with your questions if you feel they cannot wait, rather than worry about them. DECISIONS ABOUT CIRCUMCISION You may or may not know the sex of your baby. If you know your baby is a boy, it may be time to think about circumcision. Circumcision is the removal of the foreskin of the penis. This is the skin that covers the sensitive end of the penis. There is no proven medical need for this. Often this decision is made on what is popular at the time or based upon religious beliefs and social issues. You can discuss these issues with your caregiver or pediatrician. SEEK IMMEDIATE MEDICAL CARE IF:   An unexplained oral temperature above 102 F (38.9 C) develops, or as your caregiver suggests.   You have leaking of fluid from the vagina (birth canal). If leaking membranes are suspected, take your temperature and tell your caregiver of this when you call.   There is vaginal spotting, bleeding or passing clots. Tell your caregiver of the amount and how many pads are used.   You develop a bad smelling vaginal discharge with a change in the color from clear to white.   You develop vomiting that lasts more than 24 hours.   You develop chills or fever.   You develop shortness of breath.   You develop burning on urination.   You loose more than 2 pounds of weight or gain more than 2 pounds of weight or as suggested by your caregiver.   You notice sudden swelling of your face, hands, and feet or legs.   You develop belly (abdominal) pain. Round ligament discomfort is a common non-cancerous (benign) cause of abdominal pain in pregnancy. Your caregiver still must evaluate you.   You develop a severe  headache that does not go away.   You develop visual  problems, blurred or double vision.   If you have not felt your baby move for more than 1 hour. If you think the baby is not moving as much as usual, eat something with sugar in it and lie down on your left side for an hour. The baby should move at least 4 to 5 times per hour. Call right away if your baby moves less than that.   You fall, are in a car accident or any kind of trauma.   There is mental or physical violence at home.  Document Released: 07/06/2001 Document Revised: 07/01/2011 Document Reviewed: 01/08/2009 [facility] Patient Information 2012 [place], [place].

## 2011-11-04 NOTE — Progress Notes (Signed)
Addended by: Jaynie Collins A on: 11/04/2011 12:02 PM   Modules accepted: Orders

## 2011-11-05 LAB — FETAL FIBRONECTIN: Fetal Fibronectin: NEGATIVE

## 2011-11-06 ENCOUNTER — Inpatient Hospital Stay (HOSPITAL_COMMUNITY)
Admission: AD | Admit: 2011-11-06 | Discharge: 2011-11-06 | Disposition: A | Discharge status: Home / Self Care | Payer: BC Managed Care – PPO | Source: Ambulatory Visit | Attending: Obstetrics & Gynecology | Admitting: Obstetrics & Gynecology

## 2011-11-06 ENCOUNTER — Encounter (HOSPITAL_COMMUNITY): Payer: Self-pay | Admitting: Obstetrics and Gynecology

## 2011-11-06 DIAGNOSIS — O26899 Other specified pregnancy related conditions, unspecified trimester: Secondary | ICD-10-CM

## 2011-11-06 DIAGNOSIS — R109 Unspecified abdominal pain: Secondary | ICD-10-CM | POA: Insufficient documentation

## 2011-11-06 DIAGNOSIS — O2390 Unspecified genitourinary tract infection in pregnancy, unspecified trimester: Secondary | ICD-10-CM | POA: Insufficient documentation

## 2011-11-06 DIAGNOSIS — B3731 Acute candidiasis of vulva and vagina: Secondary | ICD-10-CM | POA: Insufficient documentation

## 2011-11-06 DIAGNOSIS — O479 False labor, unspecified: Secondary | ICD-10-CM

## 2011-11-06 DIAGNOSIS — O34219 Maternal care for unspecified type scar from previous cesarean delivery: Secondary | ICD-10-CM

## 2011-11-06 DIAGNOSIS — B379 Candidiasis, unspecified: Secondary | ICD-10-CM

## 2011-11-06 DIAGNOSIS — B373 Candidiasis of vulva and vagina: Secondary | ICD-10-CM | POA: Insufficient documentation

## 2011-11-06 LAB — URINALYSIS, ROUTINE W REFLEX MICROSCOPIC
Bilirubin Urine: NEGATIVE
Ketones, ur: NEGATIVE mg/dL
Nitrite: NEGATIVE
Urobilinogen, UA: 0.2 mg/dL (ref 0.0–1.0)

## 2011-11-06 NOTE — MAU Provider Note (Signed)
History     CSN: [id number]  Arrival date and time: 11/06/11 1300   First Provider Initiated Contact with Patient 11/06/11 1439      Chief Complaint  Patient presents with  . Abdominal Cramping  . Nausea   HPI Pt is here with report of cramping that started this morning.  Feels like "period" cramping.  Denies leaking of fluid or vaginal bleeding.  +fetal movement.  +nausea, no vomiting.  Pt reports having a fetal fibronectin completed two days ago - negative.    Past Medical History  Diagnosis Date  . PONV (postoperative nausea and vomiting)   . Preterm labor     Past Surgical History  Procedure Date  . Cesarean section 02/06/2008  . Wisdom tooth extraction     Family History  Problem Relation Age of Onset  . Arthritis Mother   . Hyperlipidemia Mother   . Heart disease Maternal Grandfather   . Anesthesia problems Neg Hx     History  Substance Use Topics  . Smoking status: Never Smoker   . Smokeless tobacco: Never Used  . Alcohol Use: No    Allergies: No Known Allergies  Prescriptions prior to admission  Medication Sig Dispense Refill  . acetaminophen (TYLENOL) 500 MG tablet Take 500 mg by mouth every 6 (six) hours as needed. For pain      . Prenatal Vit-Fe Fumarate-FA (MULTIVITAMIN-PRENATAL) 27-0.8 MG TABS Take 1 tablet by mouth daily.         Review of Systems  Gastrointestinal: Positive for nausea and abdominal pain (crampy). Negative for vomiting.  All other systems reviewed and are negative.   Physical Exam   Blood pressure 115/73, pulse 78, temperature 97.6 F (36.4 C), resp. rate 16, height 5\' 3"  (1.6 m), weight 88.451 kg (195 lb), last menstrual period 04/21/2011.  Physical Exam  Constitutional: She is oriented to person, place, and time. She appears well-developed and well-nourished. No distress.  HENT:  Head: Normocephalic.  Neck: Normal range of motion. Neck supple.  Cardiovascular: Normal rate, regular rhythm and normal heart sounds.     Respiratory: Effort normal and breath sounds normal.  GI: Soft. There is no tenderness.  Genitourinary: No bleeding around the vagina. Vaginal discharge (mucusy) found.       Cervix - closed; perineum red; red defined borders  Neurological: She is alert and oriented to person, place, and time.  Skin: Skin is warm and dry.    MAU Course  Procedures Results for orders placed during the hospital encounter of 11/06/11 (from the past 24 hour(s))  URINALYSIS, ROUTINE W REFLEX MICROSCOPIC     Status: Abnormal   Collection Time   11/06/11  1:05 PM      Component Value Range   Color, Urine YELLOW  YELLOW    APPearance CLEAR  CLEAR    Specific Gravity, Urine <1.005 (*) 1.005 - 1.030    pH 5.5  5.0 - 8.0    Glucose, UA NEGATIVE  NEGATIVE (mg/dL)   Hgb urine dipstick NEGATIVE  NEGATIVE    Bilirubin Urine NEGATIVE  NEGATIVE    Ketones, ur NEGATIVE  NEGATIVE (mg/dL)   Protein, ur NEGATIVE  NEGATIVE (mg/dL)   Urobilinogen, UA 0.2  0.0 - 1.0 (mg/dL)   Nitrite NEGATIVE  NEGATIVE    Leukocytes, UA NEGATIVE  NEGATIVE    Results for orders placed during the hospital encounter of 11/06/11 (from the past 24 hour(s))  URINALYSIS, ROUTINE W REFLEX MICROSCOPIC     Status: Abnormal  Collection Time   11/06/11  1:05 PM      Component Value Range   Color, Urine YELLOW  YELLOW    APPearance CLEAR  CLEAR    Specific Gravity, Urine <1.005 (*) 1.005 - 1.030    pH 5.5  5.0 - 8.0    Glucose, UA NEGATIVE  NEGATIVE (mg/dL)   Hgb urine dipstick NEGATIVE  NEGATIVE    Bilirubin Urine NEGATIVE  NEGATIVE    Ketones, ur NEGATIVE  NEGATIVE (mg/dL)   Protein, ur NEGATIVE  NEGATIVE (mg/dL)   Urobilinogen, UA 0.2  0.0 - 1.0 (mg/dL)   Nitrite NEGATIVE  NEGATIVE    Leukocytes, UA NEGATIVE  NEGATIVE   WET PREP, GENITAL     Status: Abnormal   Collection Time   11/06/11  2:50 PM      Component Value Range   Yeast Wet Prep HPF POC NONE SEEN  NONE SEEN    Trich, Wet Prep NONE SEEN  NONE SEEN    Clue Cells Wet Prep HPF  POC NONE SEEN  NONE SEEN    WBC, Wet Prep HPF POC FEW (*) NONE SEEN    FHR 130's, +accels, reactive Toco - irritability Assessment and Plan  Abdominal Pain in Pregnancy - Exam Normal Candidal Infection  Plan: DC to home Provide reassurance Use OTC Monistat on external vagina Keep scheduled appointment  [facility] 11/06/2011, 2:41 PM

## 2011-11-06 NOTE — Discharge Instructions (Signed)
Abdominal Pain During Pregnancy Belly (abdominal) pain is common during pregnancy. Most of the time, it is not a serious problem. Other times, it can be a sign that something is wrong with the pregnancy. Always tell your doctor if you have belly pain. HOME CARE For mild pain:  Do not have sex (intercourse) or put anything in your vagina until you feel better.   Rest until your pain stops. If your pain lasts longer than 1 hour, call your doctor.   Drink clear fluids if you feel sick to your stomach (nauseous).   Do not eat solid food until you feel better.   Only take medicine as told by your doctor.   Keep all doctor visits as told.  GET HELP RIGHT AWAY IF:   You are bleeding, leaking fluid, or pieces of tissue come out of your vagina.   You have more pain or cramping.   You keep throwing up (vomiting).   You have pain when you pee (urinate) or have blood in your pee.   You have a fever.   You do not feel your baby moving as much.   You feel very weak or feel like passing out.   You have trouble breathing, with or without belly pain.   You have a very bad headache and belly pain.   You have fluid leaking from your vagina and belly pain.   You keep having watery poop (diarrhea).   Your belly pain does not go away after resting, or the pain gets worse.  MAKE SURE YOU:   Understand these instructions.   Will watch your condition.   Will get help right away if you are not doing well or get worse.  Document Released: 06/30/2009 Document Revised: 07/01/2011 Document Reviewed: 02/05/2011 [facility] Patient Information 2012 [place], [place].Abdominal Pain During Pregnancy Belly (abdominal) pain is common during pregnancy. Most of the time, it is not a serious problem. Other times, it can be a sign that something is wrong with the pregnancy. Always tell your doctor if you have belly pain. HOME CARE For mild pain:  Do not have sex (intercourse) or put anything in your vagina  until you feel better.   Rest until your pain stops. If your pain lasts longer than 1 hour, call your doctor.   Drink clear fluids if you feel sick to your stomach (nauseous).   Do not eat solid food until you feel better.   Only take medicine as told by your doctor.   Keep all doctor visits as told.  GET HELP RIGHT AWAY IF:   You are bleeding, leaking fluid, or pieces of tissue come out of your vagina.   You have more pain or cramping.   You keep throwing up (vomiting).   You have pain when you pee (urinate) or have blood in your pee.   You have a fever.   You do not feel your baby moving as much.   You feel very weak or feel like passing out.   You have trouble breathing, with or without belly pain.   You have a very bad headache and belly pain.   You have fluid leaking from your vagina and belly pain.   You keep having watery poop (diarrhea).   Your belly pain does not go away after resting, or the pain gets worse.  MAKE SURE YOU:   Understand these instructions.   Will watch your condition.   Will get help right away if you are not doing  well or get worse.  Document Released: 06/30/2009 Document Revised: 07/01/2011 Document Reviewed: 02/05/2011 [facility] Patient Information 2012 [place], [place].

## 2011-11-06 NOTE — MAU Note (Signed)
Patient was seen on Tuesday for contractions was given Procardia to stop contractions, cramping irregular today, no  vaginal bleeding, no vaginal discharge, does not feel well no appetite.

## 2011-11-09 ENCOUNTER — Encounter: Payer: Self-pay | Admitting: Obstetrics & Gynecology

## 2011-11-09 ENCOUNTER — Ambulatory Visit (INDEPENDENT_AMBULATORY_CARE_PROVIDER_SITE_OTHER): Payer: BC Managed Care – PPO | Admitting: Obstetrics & Gynecology

## 2011-11-09 VITALS — BP 95/69 | HR 90 | Ht 63.0 in | Wt 195.0 lb

## 2011-11-09 DIAGNOSIS — O26899 Other specified pregnancy related conditions, unspecified trimester: Secondary | ICD-10-CM

## 2011-11-09 DIAGNOSIS — O0990 Supervision of high risk pregnancy, unspecified, unspecified trimester: Secondary | ICD-10-CM

## 2011-11-09 DIAGNOSIS — O34219 Maternal care for unspecified type scar from previous cesarean delivery: Secondary | ICD-10-CM

## 2011-11-09 DIAGNOSIS — O360990 Maternal care for other rhesus isoimmunization, unspecified trimester, not applicable or unspecified: Secondary | ICD-10-CM

## 2011-11-09 DIAGNOSIS — O09219 Supervision of pregnancy with history of pre-term labor, unspecified trimester: Secondary | ICD-10-CM

## 2011-11-09 NOTE — Progress Notes (Signed)
Seen in MAU on 11/06/11 for cramping, noted to have closed cervix, candidal infection which was treated.  FFN done here on last visit on 11/04/11 was negative. Patient here for weekly 17P.  FHT reassuring at 140, fundal height 28, no current complaints.  Reassured that there is no current evidence of PTL.  Will continue weekly 17P.  PTL/FM precautions reviewed.  Counseled about TOLAC vs RCS; risks and benefits reviewed in detail. Patient opts for TOLAC. Consent signed and will be scanned into electronic chart.

## 2011-11-09 NOTE — Patient Instructions (Signed)
Return to clinic for any obstetric concerns or go to MAU for evaluation  

## 2011-11-16 ENCOUNTER — Ambulatory Visit (INDEPENDENT_AMBULATORY_CARE_PROVIDER_SITE_OTHER): Payer: BC Managed Care – PPO | Admitting: Obstetrics & Gynecology

## 2011-11-16 ENCOUNTER — Encounter: Payer: Self-pay | Admitting: Obstetrics & Gynecology

## 2011-11-16 VITALS — BP 95/63 | Wt 198.0 lb

## 2011-11-16 DIAGNOSIS — O34219 Maternal care for unspecified type scar from previous cesarean delivery: Secondary | ICD-10-CM

## 2011-11-16 DIAGNOSIS — O09899 Supervision of other high risk pregnancies, unspecified trimester: Secondary | ICD-10-CM

## 2011-11-16 DIAGNOSIS — O09219 Supervision of pregnancy with history of pre-term labor, unspecified trimester: Secondary | ICD-10-CM

## 2011-11-16 NOTE — Progress Notes (Signed)
Routine visit. Continued, unchanged irregular contractions. Cervix closed today. Good FM. No VB or ROM. MD visit in 2 weeks, continue weekly 17 P

## 2011-11-17 NOTE — MAU Provider Note (Signed)
Attestation of Attending Supervision of Advanced Practitioner: Evaluation and management procedures were performed by the PA/NP/CNM/OB Fellow under my supervision/collaboration. Chart reviewed and agree with management and plan.  Reyn Faivre V 11/17/2011 8:25 PM

## 2011-11-23 ENCOUNTER — Ambulatory Visit (INDEPENDENT_AMBULATORY_CARE_PROVIDER_SITE_OTHER): Payer: BC Managed Care – PPO | Admitting: *Deleted

## 2011-11-23 DIAGNOSIS — O09219 Supervision of pregnancy with history of pre-term labor, unspecified trimester: Secondary | ICD-10-CM

## 2011-11-30 ENCOUNTER — Ambulatory Visit (INDEPENDENT_AMBULATORY_CARE_PROVIDER_SITE_OTHER): Payer: BC Managed Care – PPO | Admitting: Obstetrics & Gynecology

## 2011-11-30 VITALS — BP 98/67 | Wt 201.0 lb

## 2011-11-30 DIAGNOSIS — O0990 Supervision of high risk pregnancy, unspecified, unspecified trimester: Secondary | ICD-10-CM

## 2011-11-30 DIAGNOSIS — O09219 Supervision of pregnancy with history of pre-term labor, unspecified trimester: Secondary | ICD-10-CM

## 2011-11-30 NOTE — Patient Instructions (Signed)
Return to clinic for any obstetric concerns or go to MAU for evaluation  

## 2011-11-30 NOTE — Progress Notes (Signed)
Rare contractions, unchanged cervix.  No other complaints or concerns.  Fetal movement and labor precautions reviewed. Continue weekly 17P, MD visit in 2 weeks.

## 2011-12-07 ENCOUNTER — Ambulatory Visit (INDEPENDENT_AMBULATORY_CARE_PROVIDER_SITE_OTHER): Payer: BC Managed Care – PPO | Admitting: *Deleted

## 2011-12-07 DIAGNOSIS — O09219 Supervision of pregnancy with history of pre-term labor, unspecified trimester: Secondary | ICD-10-CM

## 2011-12-14 ENCOUNTER — Ambulatory Visit (INDEPENDENT_AMBULATORY_CARE_PROVIDER_SITE_OTHER): Payer: BC Managed Care – PPO | Admitting: Obstetrics & Gynecology

## 2011-12-14 DIAGNOSIS — O09219 Supervision of pregnancy with history of pre-term labor, unspecified trimester: Secondary | ICD-10-CM

## 2011-12-14 NOTE — Progress Notes (Signed)
Routine visit. PTL precautions reviewed. She declines a cervical exam today. Reports good FM. Cervical cultures at [place].

## 2011-12-14 NOTE — Progress Notes (Signed)
Routine prenatal care, still having rare contractions.  Doing well.

## 2011-12-21 ENCOUNTER — Ambulatory Visit (INDEPENDENT_AMBULATORY_CARE_PROVIDER_SITE_OTHER): Payer: BC Managed Care – PPO | Admitting: *Deleted

## 2011-12-21 DIAGNOSIS — O09219 Supervision of pregnancy with history of pre-term labor, unspecified trimester: Secondary | ICD-10-CM

## 2011-12-21 NOTE — Progress Notes (Signed)
Patient is here for weekly 17 p injection.  She is doing well and will follow up next week for routine visit and final 17 p injection.

## 2011-12-28 ENCOUNTER — Ambulatory Visit (INDEPENDENT_AMBULATORY_CARE_PROVIDER_SITE_OTHER): Payer: BC Managed Care – PPO | Admitting: Family Medicine

## 2011-12-28 VITALS — BP 104/74 | Wt 206.0 lb

## 2011-12-28 DIAGNOSIS — O09219 Supervision of pregnancy with history of pre-term labor, unspecified trimester: Secondary | ICD-10-CM

## 2011-12-28 NOTE — Progress Notes (Signed)
Routine prenatal exam, last labs.

## 2011-12-28 NOTE — Patient Instructions (Signed)
Breastfeeding BENEFITS OF BREASTFEEDING For the baby  The first milk (colostrum) helps the baby's digestive system function better.   There are antibodies from the mother in the milk that help the baby fight off infections.   The baby has a lower incidence of asthma, allergies, and SIDS (sudden infant death syndrome).   The nutrients in breast milk are better than formulas for the baby and helps the baby's brain grow better.   Babies who breastfeed have less gas, colic, and constipation.  For the mother  Breastfeeding helps develop a very special bond between mother and baby.   It is more convenient, always available at the correct temperature and cheaper than formula feeding.   It burns calories in the mother and helps with losing weight that was gained during pregnancy.   It makes the uterus contract back down to normal size faster and slows bleeding following delivery.   Breastfeeding mothers have a lower risk of developing breast cancer.  NURSE FREQUENTLY  A healthy, full-term baby may breastfeed as often as every hour or space his or her feedings to every 3 hours.   How often to nurse will vary from baby to baby. Watch your baby for signs of hunger, not the clock.   Nurse as often as the baby requests, or when you feel the need to reduce the fullness of your breasts.   Awaken the baby if it has been 3 to 4 hours since the last feeding.   Frequent feeding will help the mother make more milk and will prevent problems like sore nipples and engorgement of the breasts.  BABY'S POSITION AT THE BREAST  Whether lying down or sitting, be sure that the baby's tummy is facing your tummy.   Support the breast with 4 fingers underneath the breast and the thumb above. Make sure your fingers are well away from the nipple and baby's mouth.   Stroke the baby's lips and cheek closest to the breast gently with your finger or nipple.   When the baby's mouth is open wide enough, place all  of your nipple and as much of the dark area around the nipple as possible into your baby's mouth.   Pull the baby in close so the tip of the nose and the baby's cheeks touch the breast during the feeding.  FEEDINGS  The length of each feeding varies from baby to baby and from feeding to feeding.   The baby must suck about 2 to 3 minutes for your milk to get to him or her. This is called a "let down." For this reason, allow the baby to feed on each breast as long as he or she wants. Your baby will end the feeding when he or she has received the right balance of nutrients.   To break the suction, put your finger into the corner of the baby's mouth and slide it between his or her gums before removing your breast from his or her mouth. This will help prevent sore nipples.  REDUCING BREAST ENGORGEMENT  In the first week after your baby is born, you may experience signs of breast engorgement. When breasts are engorged, they feel heavy, warm, full, and may be tender to the touch. You can reduce engorgement if you:   Nurse frequently, every 2 to 3 hours. Mothers who breastfeed early and often have fewer problems with engorgement.   Place light ice packs on your breasts between feedings. This reduces swelling. Wrap the ice packs in a   lightweight towel to protect your skin.   Apply moist hot packs to your breast for 5 to 10 minutes before each feeding. This increases circulation and helps the milk flow.   Gently massage your breast before and during the feeding.   Make sure that the baby empties at least one breast at every feeding before switching sides.   Use a breast pump to empty the breasts if your baby is sleepy or not nursing well. You may also want to pump if you are returning to work or or you feel you are getting engorged.   Avoid bottle feeds, pacifiers or supplemental feedings of water or juice in place of breastfeeding.   Be sure the baby is latched on and positioned properly while  breastfeeding.   Prevent fatigue, stress, and anemia.   Wear a supportive bra, avoiding underwire styles.   Eat a balanced diet with enough fluids.  If you follow these suggestions, your engorgement should improve in 24 to 48 hours. If you are still experiencing difficulty, call your lactation consultant or caregiver. IS MY BABY GETTING ENOUGH MILK? Sometimes, mothers worry about whether their babies are getting enough milk. You can be assured that your baby is getting enough milk if:  The baby is actively sucking and you hear swallowing.   The baby nurses at least 8 to 12 times in a 24 hour time period. Nurse your baby until he or she unlatches or falls asleep at the first breast (at least 10 to 20 minutes), then offer the second side.   The baby is wetting 5 to 6 disposable diapers (6 to 8 cloth diapers) in a 24 hour period by 5 to 6 days of age.   The baby is having at least 2 to 3 stools every 24 hours for the first few months. Breast milk is all the food your baby needs. It is not necessary for your baby to have water or formula. In fact, to help your breasts make more milk, it is best not to give your baby supplemental feedings during the early weeks.   The stool should be soft and yellow.   The baby should gain 4 to 7 ounces per week after he is 4 days old.  TAKE CARE OF YOURSELF Take care of your breasts by:  Bathing or showering daily.   Avoiding the use of soaps on your nipples.   Start feedings on your left breast at one feeding and on your right breast at the next feeding.   You will notice an increase in your milk supply 2 to 5 days after delivery. You may feel some discomfort from engorgement, which makes your breasts very firm and often tender. Engorgement "peaks" out within 24 to 48 hours. In the meantime, apply warm moist towels to your breasts for 5 to 10 minutes before feeding. Gentle massage and expression of some milk before feeding will soften your breasts, making  it easier for your baby to latch on. Wear a well fitting nursing bra and air dry your nipples for 10 to 15 minutes after each feeding.   Only use cotton bra pads.   Only use pure lanolin on your nipples after nursing. You do not need to wash it off before nursing.  Take care of yourself by:   Eating well-balanced meals and nutritious snacks.   Drinking milk, fruit juice, and water to satisfy your thirst (about 8 glasses a day).   Getting plenty of rest.   Increasing calcium in   your diet (1200 mg a day).   Avoiding foods that you notice affect the baby in a bad way.  SEEK MEDICAL CARE IF:   You have any questions or difficulty with breastfeeding.   You need help.   You have a hard, red, sore area on your breast, accompanied by a fever of 100.5 F (38.1 C) or more.   Your baby is too sleepy to eat well or is having trouble sleeping.   Your baby is wetting less than 6 diapers per day, by 5 days of age.   Your baby's skin or white part of his or her eyes is more yellow than it was in the hospital.   You feel depressed.  Document Released: 07/12/2005 Document Revised: 07/01/2011 Document Reviewed: 02/24/2009 [employer] Patient Information 2012 [employer]. 

## 2011-12-28 NOTE — Progress Notes (Signed)
Cultures today-last 17P

## 2011-12-29 LAB — GC/CHLAMYDIA PROBE AMP, GENITAL
Chlamydia, DNA Probe: NEGATIVE
GC Probe Amp, Genital: NEGATIVE

## 2012-01-04 ENCOUNTER — Ambulatory Visit (INDEPENDENT_AMBULATORY_CARE_PROVIDER_SITE_OTHER): Payer: BC Managed Care – PPO | Admitting: Obstetrics & Gynecology

## 2012-01-04 VITALS — BP 92/63 | Wt 208.0 lb

## 2012-01-04 DIAGNOSIS — O360990 Maternal care for other rhesus isoimmunization, unspecified trimester, not applicable or unspecified: Secondary | ICD-10-CM

## 2012-01-04 DIAGNOSIS — Z6791 Unspecified blood type, Rh negative: Secondary | ICD-10-CM

## 2012-01-04 DIAGNOSIS — O34219 Maternal care for unspecified type scar from previous cesarean delivery: Secondary | ICD-10-CM

## 2012-01-04 DIAGNOSIS — O09219 Supervision of pregnancy with history of pre-term labor, unspecified trimester: Secondary | ICD-10-CM

## 2012-01-04 DIAGNOSIS — O0990 Supervision of high risk pregnancy, unspecified, unspecified trimester: Secondary | ICD-10-CM

## 2012-01-04 NOTE — Patient Instructions (Signed)
Return to clinic for any obstetric concerns or go to MAU for evaluation  

## 2012-01-04 NOTE — Progress Notes (Signed)
Routine prenatal exam, doing well, would like to be checked today.

## 2012-01-04 NOTE — Progress Notes (Signed)
GBS and GC/Chlam all negative. No other complaints or concerns.  Fetal movement and labor precautions reviewed.

## 2012-01-10 ENCOUNTER — Encounter: Payer: BC Managed Care – PPO | Admitting: Obstetrics & Gynecology

## 2012-01-11 ENCOUNTER — Ambulatory Visit (INDEPENDENT_AMBULATORY_CARE_PROVIDER_SITE_OTHER): Payer: BC Managed Care – PPO | Admitting: Obstetrics and Gynecology

## 2012-01-11 VITALS — BP 102/73 | Wt 211.0 lb

## 2012-01-11 DIAGNOSIS — O34219 Maternal care for unspecified type scar from previous cesarean delivery: Secondary | ICD-10-CM

## 2012-01-11 DIAGNOSIS — O09219 Supervision of pregnancy with history of pre-term labor, unspecified trimester: Secondary | ICD-10-CM

## 2012-01-11 DIAGNOSIS — O26899 Other specified pregnancy related conditions, unspecified trimester: Secondary | ICD-10-CM

## 2012-01-11 DIAGNOSIS — O360990 Maternal care for other rhesus isoimmunization, unspecified trimester, not applicable or unspecified: Secondary | ICD-10-CM

## 2012-01-11 DIAGNOSIS — Z6791 Unspecified blood type, Rh negative: Secondary | ICD-10-CM

## 2012-01-11 DIAGNOSIS — O09899 Supervision of other high risk pregnancies, unspecified trimester: Secondary | ICD-10-CM

## 2012-01-11 DIAGNOSIS — O0990 Supervision of high risk pregnancy, unspecified, unspecified trimester: Secondary | ICD-10-CM

## 2012-01-11 NOTE — Progress Notes (Signed)
Patient doing well without complaints. FM/labor precautions reviewed 

## 2012-01-11 NOTE — Progress Notes (Signed)
Patient is here for routine prenatal check, she is doing well, having some irregular contractions over the last couple days.

## 2012-01-18 ENCOUNTER — Ambulatory Visit (INDEPENDENT_AMBULATORY_CARE_PROVIDER_SITE_OTHER): Payer: BC Managed Care – PPO | Admitting: Obstetrics & Gynecology

## 2012-01-18 ENCOUNTER — Encounter: Payer: Self-pay | Admitting: Obstetrics & Gynecology

## 2012-01-18 VITALS — BP 109/73 | Wt 213.0 lb

## 2012-01-18 DIAGNOSIS — O0990 Supervision of high risk pregnancy, unspecified, unspecified trimester: Secondary | ICD-10-CM

## 2012-01-18 DIAGNOSIS — O34219 Maternal care for unspecified type scar from previous cesarean delivery: Secondary | ICD-10-CM

## 2012-01-18 NOTE — Progress Notes (Signed)
Routine visit. No problems. Labor precautions. Membranes stripped. Good FM. Kick counts recommended.

## 2012-01-19 ENCOUNTER — Encounter (HOSPITAL_COMMUNITY): Payer: Self-pay | Admitting: *Deleted

## 2012-01-19 ENCOUNTER — Inpatient Hospital Stay (HOSPITAL_COMMUNITY): Payer: BC Managed Care – PPO | Admitting: Anesthesiology

## 2012-01-19 ENCOUNTER — Encounter (HOSPITAL_COMMUNITY): Payer: Self-pay | Admitting: Anesthesiology

## 2012-01-19 ENCOUNTER — Inpatient Hospital Stay (HOSPITAL_COMMUNITY)
Admission: AD | Admit: 2012-01-19 | Discharge: 2012-01-21 | DRG: 373 | Disposition: A | Discharge status: Home / Self Care | Payer: BC Managed Care – PPO | Source: Ambulatory Visit | Attending: Obstetrics & Gynecology | Admitting: Obstetrics & Gynecology

## 2012-01-19 DIAGNOSIS — O34219 Maternal care for unspecified type scar from previous cesarean delivery: Secondary | ICD-10-CM

## 2012-01-19 DIAGNOSIS — IMO0001 Reserved for inherently not codable concepts without codable children: Secondary | ICD-10-CM

## 2012-01-19 LAB — CBC
MCV: 88.8 fL (ref 78.0–100.0)
Platelets: 200 10*3/uL (ref 150–400)
RBC: 4.18 MIL/uL (ref 3.87–5.11)
RDW: 12.4 % (ref 11.5–15.5)
WBC: 15 10*3/uL — ABNORMAL HIGH (ref 4.0–10.5)

## 2012-01-19 LAB — TYPE AND SCREEN
ABO/RH(D): AB NEG
Antibody Screen: NEGATIVE

## 2012-01-19 LAB — OB RESULTS CONSOLE GBS: GBS: NEGATIVE

## 2012-01-19 MED ORDER — EPHEDRINE 5 MG/ML INJ: 10.0000 mg | INTRAVENOUS | Status: DC | PRN

## 2012-01-19 MED ORDER — FENTANYL 2.5 MCG/ML BUPIVACAINE 1/10 % EPIDURAL INFUSION (WH - ANES)
INTRAMUSCULAR | Status: DC | PRN
  Administered 2012-01-19: 14 mL/h via EPIDURAL

## 2012-01-19 MED ORDER — LIDOCAINE HCL (PF) 1 % IJ SOLN
INTRAMUSCULAR | Status: DC | PRN
  Administered 2012-01-19: 8 mL
  Administered 2012-01-19: 7 mL

## 2012-01-19 MED ORDER — OXYTOCIN 40 UNITS IN LACTATED RINGERS INFUSION - SIMPLE MED
62.5000 mL/h | Freq: Once | INTRAVENOUS | Status: AC
  Administered 2012-01-19: 62.5 mL/h via INTRAVENOUS
  Filled 2012-01-19: qty 1000

## 2012-01-19 MED ORDER — LACTATED RINGERS IV SOLN: 500.0000 mL | Freq: Once | INTRAVENOUS | Status: DC

## 2012-01-19 MED ORDER — FENTANYL CITRATE 0.05 MG/ML IJ SOLN: 100.0000 ug | INTRAMUSCULAR | Status: DC | PRN

## 2012-01-19 MED ORDER — DIPHENHYDRAMINE HCL 50 MG/ML IJ SOLN: 12.5000 mg | INTRAMUSCULAR | Status: DC | PRN

## 2012-01-19 MED ORDER — IBUPROFEN 600 MG PO TABS: 600.0000 mg | ORAL_TABLET | Freq: Four times a day (QID) | ORAL | Status: DC | PRN

## 2012-01-19 MED ORDER — PHENYLEPHRINE 40 MCG/ML (10ML) SYRINGE FOR IV PUSH (FOR BLOOD PRESSURE SUPPORT): 80.0000 ug | PREFILLED_SYRINGE | INTRAVENOUS | Status: DC | PRN

## 2012-01-19 MED ORDER — OXYTOCIN BOLUS FROM INFUSION
250.0000 mL | Freq: Once | INTRAVENOUS | Status: DC
  Filled 2012-01-19: qty 500

## 2012-01-19 MED ORDER — LIDOCAINE HCL (PF) 1 % IJ SOLN
30.0000 mL | INTRAMUSCULAR | Status: DC | PRN
Start: 2012-01-19 — End: 2012-01-20
  Filled 2012-01-19: qty 30

## 2012-01-19 MED ORDER — PHENYLEPHRINE 40 MCG/ML (10ML) SYRINGE FOR IV PUSH (FOR BLOOD PRESSURE SUPPORT)
80.0000 ug | PREFILLED_SYRINGE | INTRAVENOUS | Status: DC | PRN
Start: 2012-01-19 — End: 2012-01-20
  Filled 2012-01-19: qty 5

## 2012-01-19 MED ORDER — ONDANSETRON HCL 4 MG/2ML IJ SOLN: 4.0000 mg | Freq: Four times a day (QID) | INTRAMUSCULAR | Status: DC | PRN

## 2012-01-19 MED ORDER — CITRIC ACID-SODIUM CITRATE 334-500 MG/5ML PO SOLN
30.0000 mL | ORAL | Status: DC | PRN
  Administered 2012-01-19: 30 mL via ORAL
  Filled 2012-01-19: qty 15

## 2012-01-19 MED ORDER — FENTANYL 2.5 MCG/ML BUPIVACAINE 1/10 % EPIDURAL INFUSION (WH - ANES)
14.0000 mL/h | INTRAMUSCULAR | Status: DC
  Administered 2012-01-19: 14 mL/h via EPIDURAL
  Filled 2012-01-19 (×2): qty 60

## 2012-01-19 MED ORDER — OXYCODONE-ACETAMINOPHEN 5-325 MG PO TABS: 1.0000 | ORAL_TABLET | ORAL | Status: DC | PRN

## 2012-01-19 MED ORDER — CALCIUM CARBONATE ANTACID 500 MG PO CHEW
1.0000 | CHEWABLE_TABLET | Freq: Three times a day (TID) | ORAL | Status: DC
  Administered 2012-01-19: 200 mg via ORAL
  Filled 2012-01-19: qty 2

## 2012-01-19 MED ORDER — ACETAMINOPHEN 325 MG PO TABS: 650.0000 mg | ORAL_TABLET | ORAL | Status: DC | PRN

## 2012-01-19 MED ORDER — LACTATED RINGERS IV SOLN
500.0000 mL | INTRAVENOUS | Status: DC | PRN
  Administered 2012-01-19: 500 mL via INTRAVENOUS

## 2012-01-19 MED ORDER — EPHEDRINE 5 MG/ML INJ
10.0000 mg | INTRAVENOUS | Status: DC | PRN
  Filled 2012-01-19: qty 4

## 2012-01-19 MED ORDER — FLEET ENEMA 7-19 GM/118ML RE ENEM: 1.0000 | ENEMA | RECTAL | Status: DC | PRN

## 2012-01-19 MED ORDER — LACTATED RINGERS IV SOLN
INTRAVENOUS | Status: DC
  Administered 2012-01-19 (×3): via INTRAVENOUS

## 2012-01-19 NOTE — Anesthesia Procedure Notes (Signed)
Epidural Patient location during procedure: OB Start time: 01/19/2012 3:50 PM End time: 01/19/2012 3:54 PM Reason for block: procedure for pain  Staffing Anesthesiologist: Sandrea Hughs Performed by: anesthesiologist   Preanesthetic Checklist Completed: patient identified, site marked, surgical consent, pre-op evaluation, timeout performed, IV checked, risks and benefits discussed and monitors and equipment checked  Epidural Patient position: sitting Prep: site prepped and draped and DuraPrep Patient monitoring: continuous pulse ox and blood pressure Approach: midline Injection technique: LOR air  Needle:  Needle type: Tuohy  Needle gauge: 17 G Needle length: 9 cm Needle insertion depth: 7 cm Catheter type: closed end flexible Catheter size: 19 Gauge Catheter at skin depth: 12 cm Test dose: negative and Other  Assessment Sensory level: T9 Events: blood not aspirated, injection not painful, no injection resistance, negative IV test and no paresthesia

## 2012-01-19 NOTE — MAU Note (Signed)
Contractions started at 0300, past 2 hours every 5 min.  No leaking or bleeding.

## 2012-01-19 NOTE — H&P (Signed)
Alexandra Douglas is a 31 y.o. female presenting for spontaneous onset of labor. History This is a 31 year old G2 P0102 at 39 weeks and 0 days who presents to the MAU with contractions that started last night and increased in intensity and frequency over the past 2 hours. She reports good fetal activity. She denies loss of fluid, vaginal bleeding, vaginal discharge. Surgical history significant for prior low-transverse cesarean section due to PPROM and twin gestation. She wishes for a trial of labor after cesarean.  OB History    Grav Para Term Preterm Abortions TAB SAB Ect Mult Living   2 1 0 1 0 0 0 0 1 2      Past Medical History  Diagnosis Date  . PONV (postoperative nausea and vomiting)   . Preterm labor    Past Surgical History  Procedure Date  . Cesarean section 02/06/2008  . Wisdom tooth extraction    Family History: family history includes Arthritis in her mother; Heart disease in her maternal grandfather; and Hyperlipidemia in her mother.  There is no history of Anesthesia problems. Social History:  reports that she has never smoked. She has never used smokeless tobacco. She reports that she does not drink alcohol or use illicit drugs.   Prenatal Transfer Tool  Maternal Diabetes: No Genetic Screening: Declined Maternal Ultrasounds/Referrals: Normal Fetal Ultrasounds or other Referrals:  None Maternal Substance Abuse:  No Significant Maternal Medications:  None Significant Maternal Lab Results:  None Other Comments:  None  Review of Systems  All other systems reviewed and are negative.    Dilation: 3.5 Effacement (%): 100 Exam by:: Ginger Morris RN Blood pressure 126/66, pulse 90, temperature 98.5 F (36.9 C), temperature source Oral, resp. rate 20, height 5\' 4"  (1.626 m), weight 94.802 kg (209 lb), last menstrual period 04/21/2011. Maternal Exam:  Uterine Assessment: Contraction strength is moderate.  Contraction frequency is regular.   Abdomen: Fundal height is  Term.   Estimated fetal weight is 7-7-1/2 pounds.   Fetal presentation: vertex  Introitus: Ferning test: not done.  Nitrazine test: not done.     Fetal Exam Fetal Monitor Review: Mode: hand-held doppler probe.   Baseline rate: 130.  Variability: moderate (6-25 bpm).   Pattern: no decelerations and no accelerations.    Fetal State Assessment: Category I - tracings are normal.     Physical Exam  Constitutional: She is oriented to person, place, and time. She appears well-developed and well-nourished.  HENT:  Head: Normocephalic and atraumatic.  Respiratory: Effort normal.  GI: Soft. She exhibits no distension and no mass. There is no tenderness. There is no rebound and no guarding.  Musculoskeletal: Normal range of motion.  Neurological: She is alert and oriented to person, place, and time.  Skin: Skin is warm and dry.  Psychiatric: She has a normal mood and affect. Her behavior is normal. Judgment and thought content normal.    Prenatal labs: ABO, Rh: AB/NEG/-- (11/07 1636) Antibody: NEG (04/09 1642) Rubella: 262.9 (11/07 1636) RPR: NON REAC (04/09 1642)  HBsAg: NEGATIVE (11/07 1636)  HIV: NON REACTIVE (04/09 1642)  GBS:   negative One hour GTT: 118 Ultrasound: Normal anatomy Genetics: Declined   Assessment/Plan: #1 G2 P1 0102 with intrauterine pregnancy 39 weeks  #2 GBS negative #3 active labor #4 Rh-  We'll admit to labor and delivery and continue to support the patient during labor. Patient wishes for epidural. [facility] for pediatric provider. The patient wishes to breast-feed. Oral contraceptive pills for birth control following delivery.  Jeffrie Stander JEHIEL 01/19/2012, 2:36 PM

## 2012-01-19 NOTE — Anesthesia Preprocedure Evaluation (Signed)
Anesthesia Evaluation  Patient identified by MRN, date of birth, ID band Patient awake    Reviewed: Allergy & Precautions, H&P , NPO status , Patient's Chart, lab work & pertinent test results  Airway Mallampati: II TM Distance: >3 FB Neck ROM: full    Dental No notable dental hx.    Pulmonary neg pulmonary ROS,    Pulmonary exam normal       Cardiovascular negative cardio ROS  Rate:Normal     Neuro/Psych negative neurological ROS  negative psych ROS   GI/Hepatic negative GI ROS, Neg liver ROS,   Endo/Other  Morbid obesity  Renal/GU negative Renal ROS  negative genitourinary   Musculoskeletal negative musculoskeletal ROS (+)   Abdominal (+) + obese,   Peds negative pediatric ROS (+)  Hematology negative hematology ROS (+)   Anesthesia Other Findings   Reproductive/Obstetrics (+) Pregnancy                           Anesthesia Physical Anesthesia Plan  ASA: III  Anesthesia Plan: Epidural   Post-op Pain Management:    Induction:   Airway Management Planned:   Additional Equipment:   Intra-op Plan:   Post-operative Plan:   Informed Consent: I have reviewed the patients History and Physical, chart, labs and discussed the procedure including the risks, benefits and alternatives for the proposed anesthesia with the patient or authorized representative who has indicated his/her understanding and acceptance.     Plan Discussed with:   Anesthesia Plan Comments:         Anesthesia Quick Evaluation  

## 2012-01-19 NOTE — Progress Notes (Signed)
  Subjective: Patient comfortable with epidural.  Objective: BP 136/72  Pulse 96  Temp 97.7 F (36.5 C) (Oral)  Resp 18  Ht 5\' 4"  (1.626 m)  Wt 94.802 kg (209 lb)  BMI 35.87 kg/m2  LMP 04/21/2011      FHT:  FHR: 135 bpm, variability: moderate,  accelerations:  Present,  decelerations:  Absent UC:   regular, every 2-3 minutes SVE:   Dilation: 4.5 Effacement (%): 100 Station: -1 Exam by:: Dr Adrian Blackwater  Labs: Lab Results  Component Value Date   WBC 15.0* 01/19/2012   HGB 13.1 01/19/2012   HCT 37.1 01/19/2012   MCV 88.8 01/19/2012   PLT 200 01/19/2012    Assessment / Plan: Category 1 tracing.  AROM with clear fluid.  Continue expectant management.  Alexandra Douglas JEHIEL 01/19/2012, 4:17 PM

## 2012-01-20 ENCOUNTER — Encounter (HOSPITAL_COMMUNITY): Payer: Self-pay | Admitting: *Deleted

## 2012-01-20 LAB — RPR: RPR Ser Ql: NONREACTIVE

## 2012-01-20 LAB — CBC
HCT: 32.2 % — ABNORMAL LOW (ref 36.0–46.0)
Hemoglobin: 11 g/dL — ABNORMAL LOW (ref 12.0–15.0)
RBC: 3.57 MIL/uL — ABNORMAL LOW (ref 3.87–5.11)

## 2012-01-20 MED ORDER — BENZOCAINE-MENTHOL 20-0.5 % EX AERO
1.0000 "application " | INHALATION_SPRAY | CUTANEOUS | Status: DC | PRN
  Administered 2012-01-20: 1 via TOPICAL
  Filled 2012-01-20: qty 56

## 2012-01-20 MED ORDER — IBUPROFEN 600 MG PO TABS
600.0000 mg | ORAL_TABLET | Freq: Four times a day (QID) | ORAL | Status: DC
  Administered 2012-01-20 – 2012-01-21 (×6): 600 mg via ORAL
  Filled 2012-01-20 (×6): qty 1

## 2012-01-20 MED ORDER — DIPHENHYDRAMINE HCL 25 MG PO CAPS: 25.0000 mg | ORAL_CAPSULE | Freq: Four times a day (QID) | ORAL | Status: DC | PRN

## 2012-01-20 MED ORDER — LANOLIN HYDROUS EX OINT: TOPICAL_OINTMENT | CUTANEOUS | Status: DC | PRN

## 2012-01-20 MED ORDER — DIBUCAINE 1 % RE OINT: 1.0000 "application " | TOPICAL_OINTMENT | RECTAL | Status: DC | PRN

## 2012-01-20 MED ORDER — TETANUS-DIPHTH-ACELL PERTUSSIS 5-2.5-18.5 LF-MCG/0.5 IM SUSP
0.5000 mL | Freq: Once | INTRAMUSCULAR | Status: AC
  Administered 2012-01-20: 0.5 mL via INTRAMUSCULAR
  Filled 2012-01-20: qty 0.5

## 2012-01-20 MED ORDER — ONDANSETRON HCL 4 MG PO TABS: 4.0000 mg | ORAL_TABLET | ORAL | Status: DC | PRN

## 2012-01-20 MED ORDER — ZOLPIDEM TARTRATE 5 MG PO TABS: 5.0000 mg | ORAL_TABLET | Freq: Every evening | ORAL | Status: DC | PRN

## 2012-01-20 MED ORDER — SENNOSIDES-DOCUSATE SODIUM 8.6-50 MG PO TABS
2.0000 | ORAL_TABLET | Freq: Every day | ORAL | Status: DC
  Administered 2012-01-20: 2 via ORAL

## 2012-01-20 MED ORDER — WITCH HAZEL-GLYCERIN EX PADS: 1.0000 "application " | MEDICATED_PAD | CUTANEOUS | Status: DC | PRN

## 2012-01-20 MED ORDER — PRENATAL MULTIVITAMIN CH
1.0000 | ORAL_TABLET | Freq: Every day | ORAL | Status: DC
  Administered 2012-01-20 – 2012-01-21 (×2): 1 via ORAL
  Filled 2012-01-20 (×2): qty 1

## 2012-01-20 MED ORDER — SIMETHICONE 80 MG PO CHEW: 80.0000 mg | CHEWABLE_TABLET | ORAL | Status: DC | PRN

## 2012-01-20 MED ORDER — OXYCODONE-ACETAMINOPHEN 5-325 MG PO TABS
1.0000 | ORAL_TABLET | ORAL | Status: DC | PRN
  Administered 2012-01-21: 1 via ORAL
  Filled 2012-01-20: qty 1

## 2012-01-20 MED ORDER — ONDANSETRON HCL 4 MG/2ML IJ SOLN: 4.0000 mg | INTRAMUSCULAR | Status: DC | PRN

## 2012-01-20 NOTE — Addendum Note (Signed)
Addendum  created 01/20/12 1009 by Timberlee Roblero C Lanis Storlie, CRNA   Modules edited:Charges VN, Notes Section    

## 2012-01-20 NOTE — Addendum Note (Signed)
Addendum  created 01/20/12 1009 by Suella Grove, CRNA   Modules edited:Charges VN, Notes Section

## 2012-01-20 NOTE — Anesthesia Postprocedure Evaluation (Signed)
  Anesthesia Post-op Note  Patient: Alexandra Douglas  Procedure(s) Performed: * No surgery found *  Patient Location: Mother/Baby  Anesthesia Type: Epidural  Level of Consciousness: awake  Airway and Oxygen Therapy: Patient Spontanous Breathing  Post-op Pain: none  Post-op Assessment: Patient's Cardiovascular Status Stable, Respiratory Function Stable, Patent Airway, No signs of Nausea or vomiting, Adequate PO intake, Pain level controlled, No headache, No backache, No residual numbness and No residual motor weakness  Post-op Vital Signs: Reviewed and stable  Complications: No apparent anesthesia complications

## 2012-01-20 NOTE — Progress Notes (Signed)
Post Partum Day 1 Subjective: no complaints, up ad lib, voiding and tolerating PO.  Pt c/o intermittent cramping abd pain 3/10. Pt denies ant cp, sob, or LE pain.  Objective: Blood pressure 103/60, pulse 77, temperature 98.2 F (36.8 C), temperature source Oral, resp. rate 18, height 5\' 4"  (1.626 m), weight 94.802 kg (209 lb), last menstrual period 04/21/2011, SpO2 73.00%, unknown if currently breastfeeding.  Physical Exam:  General: alert, cooperative and no distress CV: RRR, Good S1 and S2 Pulm: nonlabored breathing, CTA bil throughout Lochia: appropriate Uterine Fundus: firm, below level of umbilicus Incision: healing well, 2nd laceration with delivery DVT Evaluation: No evidence of DVT seen on physical exam. Negative Homan's sign. No cords or calf tenderness. No significant calf/ankle edema.   Basename 01/20/12 0530 01/19/12 1500  HGB 11.0* 13.1  HCT 32.2* 37.1    Assessment/Plan: Plan for discharge tomorrow Breastfeeding OCP - pt on OCP prior to pregnancy, unable to remember which one   LOS: 1 day   Alexandra Douglas 01/20/2012, 7:26 AM    Patient seen and examined.  Agree with above note.  Levie Heritage, DO 01/20/2012 7:41 AM

## 2012-01-20 NOTE — Anesthesia Postprocedure Evaluation (Signed)
Anesthesia Post Note  Patient: Alexandra Douglas  Procedure(s) Performed: * No procedures listed *  Anesthesia type: Epidural  Patient location: Mother/Baby  Post pain: Pain level controlled  Post assessment: Post-op Vital signs reviewed  Last Vitals:  Filed Vitals:   01/20/12 0600  BP: 103/60  Pulse: 77  Temp: 36.8 C  Resp: 18    Post vital signs: Reviewed  Level of consciousness: awake  Complications: No apparent anesthesia complications

## 2012-01-21 DIAGNOSIS — O34219 Maternal care for unspecified type scar from previous cesarean delivery: Secondary | ICD-10-CM

## 2012-01-21 MED ORDER — IBUPROFEN 600 MG PO TABS: 600.0000 mg | ORAL_TABLET | Freq: Four times a day (QID) | ORAL | Status: AC

## 2012-01-21 NOTE — Discharge Instructions (Signed)

## 2012-01-21 NOTE — Discharge Summary (Signed)
Obstetric Discharge Summary Reason for Admission: onset of labor Prenatal Procedures: ultrasound Intrapartum Procedures: VBAC Postpartum Procedures: none Complications-Operative and Postpartum: 2nd degree perineal laceration Hemoglobin  Date Value Range Status  01/20/2012 11.0* 12.0 - 15.0 g/dL Final     HCT  Date Value Range Status  01/20/2012 32.2* 36.0 - 46.0 % Final    Physical Exam:  General: alert, cooperative and no distress Lochia: appropriate, small amount Uterine Fundus: firm, -1 Incision: n/aDVT Evaluation: No evidence of DVT seen on physical exam. Negative Homan's sign. No cords or calf tenderness. No significant calf/ankle edema.  Discharge Diagnoses: Term Pregnancy-delivered  Discharge Information: Date: 01/21/2012 Activity: pelvic rest Diet: routine Medications: Ibuprofen Condition: stable Instructions: refer to practice specific booklet Discharge to: home Plans OCPs for contraception Follow-up Information    Please follow up. (Follow up at [facility] for postpartum visit in 4-6 weeks. )          Newborn Data: Live born female  Birth Weight: 7 lb 7 oz (3374 g) APGAR: 7, 9  Home with mother.  Douglas, Alexandra Arko 01/21/2012, 7:04 AM

## 2012-01-25 ENCOUNTER — Encounter: Payer: BC Managed Care – PPO | Admitting: Family Medicine

## 2012-03-08 ENCOUNTER — Encounter: Payer: Self-pay | Admitting: Obstetrics & Gynecology

## 2012-03-08 ENCOUNTER — Ambulatory Visit (INDEPENDENT_AMBULATORY_CARE_PROVIDER_SITE_OTHER): Payer: BC Managed Care – PPO | Admitting: Obstetrics & Gynecology

## 2012-03-08 MED ORDER — NORETHINDRONE 0.35 MG PO TABS: 1.0000 | ORAL_TABLET | Freq: Every day | ORAL | Status: DC

## 2012-03-08 NOTE — Progress Notes (Signed)
  Subjective:    Patient ID: Alexandra Douglas, female    DOB: [birth year], 31 y.o.   MRN: [id number]  HPI  Alexandra Douglas is now 6 weeks pp after a VBAC. She has no complaints. She reports normal bowel and bladder function. No sex yet. She wants to start POP but may get a Mirena in the future. The baby is growing well (over 10 pounds now). She denies any current baby blues, but says that the first 2 weeks "were crazy". She is breast feeding without difficulty.  Review of Systems Pap normal 11/12    Objective:   Physical Exam   Well healed vulva/vagina. Atrophic. Well involuted uterus, no adnexal masses or tenderness     Assessment & Plan:  PP doing well Micronor now, possibly Mirena in the future. 80% efficacy quoted for Micronor. Back up method for 1 month

## 2012-07-26 HISTORY — PX: OTHER SURGICAL HISTORY: SHX169

## 2012-08-20 ENCOUNTER — Ambulatory Visit: Payer: Self-pay | Admitting: Internal Medicine

## 2013-03-07 NOTE — Progress Notes (Signed)
This encounter was created in error - please disregard.

## 2013-04-11 ENCOUNTER — Telehealth: Payer: Self-pay | Admitting: *Deleted

## 2013-04-11 DIAGNOSIS — IMO0001 Reserved for inherently not codable concepts without codable children: Secondary | ICD-10-CM

## 2013-04-11 MED ORDER — NORGESTIMATE-ETH ESTRADIOL 0.25-35 MG-MCG PO TABS: 1.0000 | ORAL_TABLET | Freq: Every day | ORAL | Status: DC

## 2013-04-11 NOTE — Telephone Encounter (Signed)
Patient wishes to get back on her birth control.  She will call back once she gets her schedule straight to set up an appointment for a physical.

## 2013-07-31 ENCOUNTER — Ambulatory Visit (INDEPENDENT_AMBULATORY_CARE_PROVIDER_SITE_OTHER): Payer: BC Managed Care – PPO | Admitting: Family Medicine

## 2013-07-31 ENCOUNTER — Encounter: Payer: Self-pay | Admitting: Family Medicine

## 2013-07-31 VITALS — BP 107/72 | HR 73 | Ht 63.0 in | Wt 206.8 lb

## 2013-07-31 DIAGNOSIS — L989 Disorder of the skin and subcutaneous tissue, unspecified: Secondary | ICD-10-CM | POA: Insufficient documentation

## 2013-07-31 DIAGNOSIS — Z124 Encounter for screening for malignant neoplasm of cervix: Secondary | ICD-10-CM

## 2013-07-31 DIAGNOSIS — C439 Malignant melanoma of skin, unspecified: Secondary | ICD-10-CM

## 2013-07-31 DIAGNOSIS — IMO0001 Reserved for inherently not codable concepts without codable children: Secondary | ICD-10-CM

## 2013-07-31 DIAGNOSIS — E669 Obesity, unspecified: Secondary | ICD-10-CM | POA: Insufficient documentation

## 2013-07-31 DIAGNOSIS — Z1151 Encounter for screening for human papillomavirus (HPV): Secondary | ICD-10-CM

## 2013-07-31 DIAGNOSIS — Z01419 Encounter for gynecological examination (general) (routine) without abnormal findings: Secondary | ICD-10-CM

## 2013-07-31 DIAGNOSIS — Z309 Encounter for contraceptive management, unspecified: Secondary | ICD-10-CM

## 2013-07-31 HISTORY — DX: Disorder of the skin and subcutaneous tissue, unspecified: L98.9

## 2013-07-31 LAB — CBC
HCT: 39.6 % (ref 36.0–46.0)
Hemoglobin: 13.6 g/dL (ref 12.0–15.0)
MCH: 29.5 pg (ref 26.0–34.0)
MCHC: 34.3 g/dL (ref 30.0–36.0)
MCV: 85.9 fL (ref 78.0–100.0)
PLATELETS: 308 10*3/uL (ref 150–400)
RBC: 4.61 MIL/uL (ref 3.87–5.11)
RDW: 12.8 % (ref 11.5–15.5)
WBC: 6.9 10*3/uL (ref 4.0–10.5)

## 2013-07-31 MED ORDER — NORGESTIMATE-ETH ESTRADIOL 0.25-35 MG-MCG PO TABS: 1.0000 | ORAL_TABLET | Freq: Every day | ORAL | Status: DC

## 2013-07-31 NOTE — Progress Notes (Signed)
  Subjective:     Alexandra Douglas is a 33 y.o. female and is here for a comprehensive physical exam. The patient reports no problems. Is teaching on line high school science at present.  Has 3 girls and is using OC's for contraception.  Declines flu shot today. Has had several moles removed this year with melanoma but clear margins.  Seeing Derm q 6 months. Unsure about having more children.  3 girls at present.  History   Social History  . Marital Status: Married    Spouse Name: N/A    Number of Children: N/A  . Years of Education: N/A   Occupational History  . Not on file.   Social History Main Topics  . Smoking status: Never Smoker   . Smokeless tobacco: Never Used  . Alcohol Use: No  . Drug Use: No  . Sexual Activity: Yes    Partners: Male    Birth Control/ Protection: OCP   Other Topics Concern  . Not on file   Social History Narrative  . No narrative on file   Health Maintenance  Topic Date Due  . Influenza Vaccine  04/30/2014  . Pap Smear  06/15/2014  . Tetanus/tdap  01/19/2022    The following portions of the patient's history were reviewed and updated as appropriate: allergies, current medications, past family history, past medical history, past social history, past surgical history and problem list.  Review of Systems A comprehensive review of systems was negative.   Objective:    BP 107/72  Pulse 73  Ht 5\' 3"  (1.6 m)  Wt 206 lb 12.8 oz (93.804 kg)  BMI 36.64 kg/m2  LMP 07/19/2013 General appearance: alert, cooperative and appears stated age Head: Normocephalic, without obvious abnormality, atraumatic Neck: no adenopathy, supple, symmetrical, trachea midline and thyroid not enlarged, symmetric, no tenderness/mass/nodules Lungs: clear to auscultation bilaterally Breasts: normal appearance, no masses or tenderness Heart: regular rate and rhythm, S1, S2 normal, no murmur, click, rub or gallop Abdomen: soft, non-tender; bowel sounds normal; no masses,   no organomegaly Pelvic: cervix normal in appearance, external genitalia normal, no adnexal masses or tenderness, no cervical motion tenderness, uterus normal size, shape, and consistency and vagina normal without discharge Extremities: extremities normal, atraumatic, no cyanosis or edema Pulses: 2+ and symmetric Skin: Skin color, texture, turgor normal. No rashes or lesions Lymph nodes: Cervical, supraclavicular, and axillary nodes normal. Neurologic: Grossly normal    Assessment:    Healthy female exam. Doing well, recent melanoma diagnosis     Plan:    Pap smear today Annual labs Declines flu F/u with Derm Continue OC's. See After Visit Summary for Counseling Recommendations

## 2013-07-31 NOTE — Patient Instructions (Signed)
Preventive Care for Adults, Female A healthy lifestyle and preventive care can promote health and wellness. Preventive health guidelines for women include the following key practices.  A routine yearly physical is a good way to check with your caregiver about your health and preventive screening. It is a chance to share any concerns and updates on your health, and to receive a thorough exam.  Visit your dentist for a routine exam and preventive care every 6 months. Brush your teeth twice a day and floss once a day. Good oral hygiene prevents tooth decay and gum disease.  The frequency of eye exams is based on your age, health, family medical history, use of contact lenses, and other factors. Follow your caregiver's recommendations for frequency of eye exams.  Eat a healthy diet. Foods like vegetables, fruits, whole grains, low-fat dairy products, and lean protein foods contain the nutrients you need without too many calories. Decrease your intake of foods high in solid fats, added sugars, and salt. Eat the right amount of calories for you.Get information about a proper diet from your caregiver, if necessary.  Regular physical exercise is one of the most important things you can do for your health. Most adults should get at least 150 minutes of moderate-intensity exercise (any activity that increases your heart rate and causes you to sweat) each week. In addition, most adults need muscle-strengthening exercises on 2 or more days a week.  Maintain a healthy weight. The body mass index (BMI) is a screening tool to identify possible weight problems. It provides an estimate of body fat based on height and weight. Your caregiver can help determine your BMI, and can help you achieve or maintain a healthy weight.For adults 20 years and older:  A BMI below 18.5 is considered underweight.  A BMI of 18.5 to 24.9 is normal.  A BMI of 25 to 29.9 is considered overweight.  A BMI of 30 and above is  considered obese.  Maintain normal blood lipids and cholesterol levels by exercising and minimizing your intake of saturated fat. Eat a balanced diet with plenty of fruit and vegetables. Blood tests for lipids and cholesterol should begin at age 20 and be repeated every 5 years. If your lipid or cholesterol levels are high, you are over 50, or you are at high risk for heart disease, you may need your cholesterol levels checked more frequently.Ongoing high lipid and cholesterol levels should be treated with medicines if diet and exercise are not effective.  If you smoke, find out from your caregiver how to quit. If you do not use tobacco, do not start.  Lung cancer screening is recommended for adults aged 55 80 years who are at high risk for developing lung cancer because of a history of smoking. Yearly low-dose computed tomography (CT) is recommended for people who have at least a 30-pack-year history of smoking and are a current smoker or have quit within the past 15 years. A pack year of smoking is smoking an average of 1 pack of cigarettes a day for 1 year (for example: 1 pack a day for 30 years or 2 packs a day for 15 years). Yearly screening should continue until the smoker has stopped smoking for at least 15 years. Yearly screening should also be stopped for people who develop a health problem that would prevent them from having lung cancer treatment.  If you are pregnant, do not drink alcohol. If you are breastfeeding, be very cautious about drinking alcohol. If you are   not pregnant and choose to drink alcohol, do not exceed 1 drink per day. One drink is considered to be 12 ounces (355 mL) of beer, 5 ounces (148 mL) of wine, or 1.5 ounces (44 mL) of liquor.  Avoid use of street drugs. Do not share needles with anyone. Ask for help if you need support or instructions about stopping the use of drugs.  High blood pressure causes heart disease and increases the risk of stroke. Your blood pressure  should be checked at least every 1 to 2 years. Ongoing high blood pressure should be treated with medicines if weight loss and exercise are not effective.  If you are 61 to 34 years old, ask your caregiver if you should take aspirin to prevent strokes.  Diabetes screening involves taking a blood sample to check your fasting blood sugar level. This should be done once every 3 years, after age 73, if you are within normal weight and without risk factors for diabetes. Testing should be considered at a younger age or be carried out more frequently if you are overweight and have at least 1 risk factor for diabetes.  Breast cancer screening is essential preventive care for women. You should practice "breast self-awareness." This means understanding the normal appearance and feel of your breasts and may include breast self-examination. Any changes detected, no matter how small, should be reported to a caregiver. Women in their 75s and 30s should have a clinical breast exam (CBE) by a caregiver as part of a regular health exam every 1 to 3 years. After age 53, women should have a CBE every year. Starting at age 60, women should consider having a mammography (breast X-ray test) every year. Women who have a family history of breast cancer should talk to their caregiver about genetic screening. Women at a high risk of breast cancer should talk to their caregivers about having magnetic resonance imaging (MRI) and a mammography every year.  Breast cancer gene (BRCA)-related cancer risk assessment is recommended for women who have family members with BRCA-related cancers. BRCA-related cancers include breast, ovarian, tubal, and peritoneal cancers. Having family members with these cancers may be associated with an increased risk for harmful changes (mutations) in the breast cancer genes BRCA1 and BRCA2. Results of the assessment will determine the need for genetic counseling and BRCA1 and BRCA2 testing.  The Pap test is  a screening test for cervical cancer. A Pap test can show cell changes on the cervix that might become cervical cancer if left untreated. A Pap test is a procedure in which cells are obtained and examined from the lower end of the uterus (cervix).  Women should have a Pap test starting at age 85.  Between ages 23 and 51, Pap tests should be repeated every 2 years.  Beginning at age 42, you should have a Pap test every 3 years as long as the past 3 Pap tests have been normal.  Some women have medical problems that increase the chance of getting cervical cancer. Talk to your caregiver about these problems. It is especially important to talk to your caregiver if a new problem develops soon after your last Pap test. In these cases, your caregiver may recommend more frequent screening and Pap tests.  The above recommendations are the same for women who have or have not gotten the vaccine for human papillomavirus (HPV).  If you had a hysterectomy for a problem that was not cancer or a condition that could lead to cancer, then  you no longer need Pap tests. Even if you no longer need a Pap test, a regular exam is a good idea to make sure no other problems are starting.  If you are between ages 79 and 19, and you have had normal Pap tests going back 10 years, you no longer need Pap tests. Even if you no longer need a Pap test, a regular exam is a good idea to make sure no other problems are starting.  If you have had past treatment for cervical cancer or a condition that could lead to cancer, you need Pap tests and screening for cancer for at least 20 years after your treatment.  If Pap tests have been discontinued, risk factors (such as a new sexual partner) need to be reassessed to determine if screening should be resumed.  The HPV test is an additional test that may be used for cervical cancer screening. The HPV test looks for the virus that can cause the cell changes on the cervix. The cells collected  during the Pap test can be tested for HPV. The HPV test could be used to screen women aged 90 years and older, and should be used in women of any age who have unclear Pap test results. After the age of 9, women should have HPV testing at the same frequency as a Pap test.  Colorectal cancer can be detected and often prevented. Most routine colorectal cancer screening begins at the age of 63 and continues through age 61. However, your caregiver may recommend screening at an earlier age if you have risk factors for colon cancer. On a yearly basis, your caregiver may provide home test kits to check for hidden blood in the stool. Use of a small camera at the end of a tube, to directly examine the colon (sigmoidoscopy or colonoscopy), can detect the earliest forms of colorectal cancer. Talk to your caregiver about this at age 32, when routine screening begins. Direct examination of the colon should be repeated every 5 to 10 years through age 7, unless early forms of pre-cancerous polyps or small growths are found.  Hepatitis C blood testing is recommended for all people born from [birth year] through 1965 and any individual with known risks for hepatitis C.  Practice safe sex. Use condoms and avoid high-risk sexual practices to reduce the spread of sexually transmitted infections (STIs). STIs include gonorrhea, chlamydia, syphilis, trichomonas, herpes, HPV, and human immunodeficiency virus (HIV). Herpes, HIV, and HPV are viral illnesses that have no cure. They can result in disability, cancer, and death. Sexually active women aged 7 and younger should be checked for chlamydia. Older women with new or multiple partners should also be tested for chlamydia. Testing for other STIs is recommended if you are sexually active and at increased risk.  Osteoporosis is a disease in which the bones lose minerals and strength with aging. This can result in serious bone fractures. The risk of osteoporosis can be identified using a  bone density scan. Women ages 30 and over and women at risk for fractures or osteoporosis should discuss screening with their caregivers. Ask your caregiver whether you should take a calcium supplement or vitamin D to reduce the rate of osteoporosis.  Menopause can be associated with physical symptoms and risks. Hormone replacement therapy is available to decrease symptoms and risks. You should talk to your caregiver about whether hormone replacement therapy is right for you.  Use sunscreen. Apply sunscreen liberally and repeatedly throughout the day. You should seek shade  when your shadow is shorter than you. Protect yourself by wearing long sleeves, pants, a wide-brimmed hat, and sunglasses year round, whenever you are outdoors.  Once a month, do a whole body skin exam, using a mirror to look at the skin on your back. Notify your caregiver of new moles, moles that have irregular borders, moles that are larger than a pencil eraser, or moles that have changed in shape or color.  Stay current with required immunizations.  Influenza vaccine. All adults should be immunized every year.  Tetanus, diphtheria, and acellular pertussis (Td, Tdap) vaccine. Pregnant women should receive 1 dose of Tdap vaccine during each pregnancy. The dose should be obtained regardless of the length of time since the last dose. Immunization is preferred during the 27th to 36th week of gestation. An adult who has not previously received Tdap or who does not know her vaccine status should receive 1 dose of Tdap. This initial dose should be followed by tetanus and diphtheria toxoids (Td) booster doses every 10 years. Adults with an unknown or incomplete history of completing a 3-dose immunization series with Td-containing vaccines should begin or complete a primary immunization series including a Tdap dose. Adults should receive a Td booster every 10 years.  Varicella vaccine. An adult without evidence of immunity to varicella  should receive 2 doses or a second dose if she has previously received 1 dose. Pregnant females who do not have evidence of immunity should receive the first dose after pregnancy. This first dose should be obtained before leaving the health care facility. The second dose should be obtained 4 8 weeks after the first dose.  Human papillomavirus (HPV) vaccine. Females aged 79 26 years who have not received the vaccine previously should obtain the 3-dose series. The vaccine is not recommended for use in pregnant females. However, pregnancy testing is not needed before receiving a dose. If a female is found to be pregnant after receiving a dose, no treatment is needed. In that case, the remaining doses should be delayed until after the pregnancy. Immunization is recommended for any person with an immunocompromised condition through the age of 21 years if she did not get any or all doses earlier. During the 3-dose series, the second dose should be obtained 4 8 weeks after the first dose. The third dose should be obtained 24 weeks after the first dose and 16 weeks after the second dose.  Zoster vaccine. One dose is recommended for adults aged 56 years or older unless certain conditions are present.  Measles, mumps, and rubella (MMR) vaccine. Adults born before [birth year] generally are considered immune to measles and mumps. Adults born in [birth year] or later should have 1 or more doses of MMR vaccine unless there is a contraindication to the vaccine or there is laboratory evidence of immunity to each of the three diseases. A routine second dose of MMR vaccine should be obtained at least 28 days after the first dose for students attending postsecondary schools, health care workers, or international travelers. People who received inactivated measles vaccine or an unknown type of measles vaccine during 1963 1967 should receive 2 doses of MMR vaccine. People who received inactivated mumps vaccine or an unknown type of mumps vaccine  before 1979 and are at high risk for mumps infection should consider immunization with 2 doses of MMR vaccine. For females of childbearing age, rubella immunity should be determined. If there is no evidence of immunity, females who are not pregnant should be vaccinated. If there  is no evidence of immunity, females who are pregnant should delay immunization until after pregnancy. Unvaccinated health care workers born before [birth year] who lack laboratory evidence of measles, mumps, or rubella immunity or laboratory confirmation of disease should consider measles and mumps immunization with 2 doses of MMR vaccine or rubella immunization with 1 dose of MMR vaccine.  Pneumococcal 13-valent conjugate (PCV13) vaccine. When indicated, a person who is uncertain of her immunization history and has no record of immunization should receive the PCV13 vaccine. An adult aged 57 years or older who has certain medical conditions and has not been previously immunized should receive 1 dose of PCV13 vaccine. This PCV13 should be followed with a dose of pneumococcal polysaccharide (PPSV23) vaccine. The PPSV23 vaccine dose should be obtained at least 8 weeks after the dose of PCV13 vaccine. An adult aged 27 years or older who has certain medical conditions and previously received 1 or more doses of PPSV23 vaccine should receive 1 dose of PCV13. The PCV13 vaccine dose should be obtained 1 or more years after the last PPSV23 vaccine dose.  Pneumococcal polysaccharide (PPSV23) vaccine. When PCV13 is also indicated, PCV13 should be obtained first. All adults aged 21 years and older should be immunized. An adult younger than age 83 years who has certain medical conditions should be immunized. Any person who resides in a nursing home or long-term care facility should be immunized. An adult smoker should be immunized. People with an immunocompromised condition and certain other conditions should receive both PCV13 and PPSV23 vaccines. People  with human immunodeficiency virus (HIV) infection should be immunized as soon as possible after diagnosis. Immunization during chemotherapy or radiation therapy should be avoided. Routine use of PPSV23 vaccine is not recommended for American Indians, [place] Natives, or people younger than 65 years unless there are medical conditions that require PPSV23 vaccine. When indicated, people who have unknown immunization and have no record of immunization should receive PPSV23 vaccine. One-time revaccination 5 years after the first dose of PPSV23 is recommended for people aged 4 64 years who have chronic kidney failure, nephrotic syndrome, asplenia, or immunocompromised conditions. People who received 1 2 doses of PPSV23 before age 54 years should receive another dose of PPSV23 vaccine at age 47 years or later if at least 5 years have passed since the previous dose. Doses of PPSV23 are not needed for people immunized with PPSV23 at or after age 39 years.  Meningococcal vaccine. Adults with asplenia or persistent complement component deficiencies should receive 2 doses of quadrivalent meningococcal conjugate (MenACWY-D) vaccine. The doses should be obtained at least 2 months apart. Microbiologists working with certain meningococcal bacteria, [employer], people at risk during an outbreak, and people who travel to or live in countries with a high rate of meningitis should be immunized. A first-year college student up through age 49 years who is living in a residence hall should receive a dose if she did not receive a dose on or after her 16th birthday. Adults who have certain high-risk conditions should receive one or more doses of vaccine.  Hepatitis A vaccine. Adults who wish to be protected from this disease, have certain high-risk conditions, work with hepatitis A-infected animals, work in hepatitis A research labs, or travel to or work in countries with a high rate of hepatitis A should be immunized. Adults  who were previously unvaccinated and who anticipate close contact with an international adoptee during the first 60 days after arrival in the [place] from a country  with a high rate of hepatitis A should be immunized.  Hepatitis B vaccine. Adults who wish to be protected from this disease, have certain high-risk conditions, may be exposed to blood or other infectious body fluids, are household contacts or sex partners of hepatitis B positive people, are clients or workers in certain care facilities, or travel to or work in countries with a high rate of hepatitis B should be immunized.  Haemophilus influenzae type b (Hib) vaccine. A previously unvaccinated person with asplenia or sickle cell disease or having a scheduled splenectomy should receive 1 dose of Hib vaccine. Regardless of previous immunization, a recipient of a hematopoietic stem cell transplant should receive a 3-dose series 6 12 months after her successful transplant. Hib vaccine is not recommended for adults with HIV infection. Preventive Services / Frequency Ages 91 to 60  Blood pressure check.** / Every 1 to 2 years.  Lipid and cholesterol check.** / Every 5 years beginning at age 64.  Clinical breast exam.** / Every 3 years for women in their 85s and 65s.  BRCA-related cancer risk assessment.** / For women who have family members with a BRCA-related cancer (breast, ovarian, tubal, or peritoneal cancers).  Pap test.** / Every 2 years from ages 20 through 29. Every 3 years starting at age 5 through age 3 or 18 with a history of 3 consecutive normal Pap tests.  HPV screening.** / Every 3 years from ages 52 through ages 45 to 72 with a history of 3 consecutive normal Pap tests.  Hepatitis C blood test.** / For any individual with known risks for hepatitis C.  Skin self-exam. / Monthly.  Influenza vaccine. / Every year.  Tetanus, diphtheria, and acellular pertussis (Tdap, Td) vaccine.** / Consult your caregiver. Pregnant  women should receive 1 dose of Tdap vaccine during each pregnancy. 1 dose of Td every 10 years.  Varicella vaccine.** / Consult your caregiver. Pregnant females who do not have evidence of immunity should receive the first dose after pregnancy.  HPV vaccine. / 3 doses over 6 months, if 45 and younger. The vaccine is not recommended for use in pregnant females. However, pregnancy testing is not needed before receiving a dose.  Measles, mumps, rubella (MMR) vaccine.** / You need at least 1 dose of MMR if you were born in 1957 or later. You may also need a 2nd dose. For females of childbearing age, rubella immunity should be determined. If there is no evidence of immunity, females who are not pregnant should be vaccinated. If there is no evidence of immunity, females who are pregnant should delay immunization until after pregnancy.  Pneumococcal 13-valent conjugate (PCV13) vaccine.** / Consult your caregiver.  Pneumococcal polysaccharide (PPSV23) vaccine.** / 1 to 2 doses if you smoke cigarettes or if you have certain conditions.  Meningococcal vaccine.** / 1 dose if you are age 53 to 51 years and a Market researcher living in a residence hall, or have one of several medical conditions, you need to get vaccinated against meningococcal disease. You may also need additional booster doses.  Hepatitis A vaccine.** / Consult your caregiver.  Hepatitis B vaccine.** / Consult your caregiver.  Haemophilus influenzae type b (Hib) vaccine.** / Consult your caregiver. Ages 26 to 40  Blood pressure check.** / Every 1 to 2 years.  Lipid and cholesterol check.** / Every 5 years beginning at age 70.  Lung cancer screening. / Every year if you are aged 46 80 years and have a 30-pack-year history of smoking and  currently smoke or have quit within the past 15 years. Yearly screening is stopped once you have quit smoking for at least 15 years or develop a health problem that would prevent you from having  lung cancer treatment.  Clinical breast exam.** / Every year after age 30.  BRCA-related cancer risk assessment.** / For women who have family members with a BRCA-related cancer (breast, ovarian, tubal, or peritoneal cancers).  Mammogram.** / Every year beginning at age 49 and continuing for as long as you are in good health. Consult with your caregiver.  Pap test.** / Every 3 years starting at age 21 through age 85 or 17 with a history of 3 consecutive normal Pap tests.  HPV screening.** / Every 3 years from ages 66 through ages 62 to 48 with a history of 3 consecutive normal Pap tests.  Fecal occult blood test (FOBT) of stool. / Every year beginning at age 69 and continuing until age 80. You may not need to do this test if you get a colonoscopy every 10 years.  Flexible sigmoidoscopy or colonoscopy.** / Every 5 years for a flexible sigmoidoscopy or every 10 years for a colonoscopy beginning at age 23 and continuing until age 40.  Hepatitis C blood test.** / For all people born from [birth year] through 1966 and any individual with known risks for hepatitis C.  Skin self-exam. / Monthly.  Influenza vaccine. / Every year.  Tetanus, diphtheria, and acellular pertussis (Tdap/Td) vaccine.** / Consult your caregiver. Pregnant women should receive 1 dose of Tdap vaccine during each pregnancy. 1 dose of Td every 10 years.  Varicella vaccine.** / Consult your caregiver. Pregnant females who do not have evidence of immunity should receive the first dose after pregnancy.  Zoster vaccine.** / 1 dose for adults aged 10 years or older.  Measles, mumps, rubella (MMR) vaccine.** / You need at least 1 dose of MMR if you were born in 1957 or later. You may also need a 2nd dose. For females of childbearing age, rubella immunity should be determined. If there is no evidence of immunity, females who are not pregnant should be vaccinated. If there is no evidence of immunity, females who are pregnant should delay  immunization until after pregnancy.  Pneumococcal 13-valent conjugate (PCV13) vaccine.** / Consult your caregiver.  Pneumococcal polysaccharide (PPSV23) vaccine.** / 1 to 2 doses if you smoke cigarettes or if you have certain conditions.  Meningococcal vaccine.** / Consult your caregiver.  Hepatitis A vaccine.** / Consult your caregiver.  Hepatitis B vaccine.** / Consult your caregiver.  Haemophilus influenzae type b (Hib) vaccine.** / Consult your caregiver. Ages 17 and over  Blood pressure check.** / Every 1 to 2 years.  Lipid and cholesterol check.** / Every 5 years beginning at age 64.  Lung cancer screening. / Every year if you are aged 39 80 years and have a 30-pack-year history of smoking and currently smoke or have quit within the past 15 years. Yearly screening is stopped once you have quit smoking for at least 15 years or develop a health problem that would prevent you from having lung cancer treatment.  Clinical breast exam.** / Every year after age 23.  BRCA-related cancer risk assessment.** / For women who have family members with a BRCA-related cancer (breast, ovarian, tubal, or peritoneal cancers).  Mammogram.** / Every year beginning at age 66 and continuing for as long as you are in good health. Consult with your caregiver.  Pap test.** / Every 3 years starting at age  70 through age 47 or 66 with a 3 consecutive normal Pap tests. Testing can be stopped between 65 and 70 with 3 consecutive normal Pap tests and no abnormal Pap or HPV tests in the past 10 years.  HPV screening.** / Every 3 years from ages 79 through ages 33 or 59 with a history of 3 consecutive normal Pap tests. Testing can be stopped between 65 and 70 with 3 consecutive normal Pap tests and no abnormal Pap or HPV tests in the past 10 years.  Fecal occult blood test (FOBT) of stool. / Every year beginning at age 63 and continuing until age 5. You may not need to do this test if you get a colonoscopy  every 10 years.  Flexible sigmoidoscopy or colonoscopy.** / Every 5 years for a flexible sigmoidoscopy or every 10 years for a colonoscopy beginning at age 31 and continuing until age 71.  Hepatitis C blood test.** / For all people born from [birth year] through 1966 and any individual with known risks for hepatitis C.  Osteoporosis screening.** / A one-time screening for women ages 33 and over and women at risk for fractures or osteoporosis.  Skin self-exam. / Monthly.  Influenza vaccine. / Every year.  Tetanus, diphtheria, and acellular pertussis (Tdap/Td) vaccine.** / 1 dose of Td every 10 years.  Varicella vaccine.** / Consult your caregiver.  Zoster vaccine.** / 1 dose for adults aged 1 years or older.  Pneumococcal 13-valent conjugate (PCV13) vaccine.** / Consult your caregiver.  Pneumococcal polysaccharide (PPSV23) vaccine.** / 1 dose for all adults aged 43 years and older.  Meningococcal vaccine.** / Consult your caregiver.  Hepatitis A vaccine.** / Consult your caregiver.  Hepatitis B vaccine.** / Consult your caregiver.  Haemophilus influenzae type b (Hib) vaccine.** / Consult your caregiver. ** Family history and personal history of risk and conditions may change your caregiver's recommendations. Document Released: 09/07/2001 Document Revised: 11/06/2012 Document Reviewed: 12/07/2010 [facility] Patient Information 2014 [place], [place].

## 2013-08-01 ENCOUNTER — Telehealth: Payer: Self-pay | Admitting: *Deleted

## 2013-08-01 LAB — COMPREHENSIVE METABOLIC PANEL
ALK PHOS: 70 U/L (ref 39–117)
AST: 17 U/L (ref 0–37)
Albumin: 4.2 g/dL (ref 3.5–5.2)
BILIRUBIN TOTAL: 0.6 mg/dL (ref 0.3–1.2)
BUN: 8 mg/dL (ref 6–23)
CO2: 27 mEq/L (ref 19–32)
Calcium: 8.7 mg/dL (ref 8.4–10.5)
Chloride: 102 mEq/L (ref 96–112)
Creat: 0.63 mg/dL (ref 0.50–1.10)
Glucose, Bld: 76 mg/dL (ref 70–99)
Potassium: 4.2 mEq/L (ref 3.5–5.3)
SODIUM: 139 meq/L (ref 135–145)
TOTAL PROTEIN: 6.8 g/dL (ref 6.0–8.3)

## 2013-08-01 LAB — TSH: TSH: 1.162 u[IU]/mL (ref 0.350–4.500)

## 2013-08-01 LAB — LIPID PANEL
CHOL/HDL RATIO: 3.6 ratio
Cholesterol: 171 mg/dL (ref 0–200)
HDL: 48 mg/dL (ref >39)
LDL CALC: 99 mg/dL (ref 0–99)
Triglycerides: 120 mg/dL (ref <150)
VLDL: 24 mg/dL (ref 0–40)

## 2013-08-01 NOTE — Telephone Encounter (Signed)
Pt aware.

## 2013-08-01 NOTE — Telephone Encounter (Signed)
Message copied by Gerri Spore on Wed Aug 01, 2013 10:52 AM ------      Message from: Donnamae Jude      Created: Wed Aug 01, 2013  9:35 AM       Labs all look good--please inform pt. ------

## 2014-05-27 ENCOUNTER — Encounter: Payer: Self-pay | Admitting: Family Medicine

## 2014-07-24 ENCOUNTER — Other Ambulatory Visit: Payer: Self-pay | Admitting: Family Medicine

## 2015-05-26 ENCOUNTER — Telehealth: Payer: Self-pay | Admitting: *Deleted

## 2015-05-26 DIAGNOSIS — Z3041 Encounter for surveillance of contraceptive pills: Secondary | ICD-10-CM

## 2015-05-26 MED ORDER — NORGESTIMATE-ETH ESTRADIOL 0.25-35 MG-MCG PO TABS: 1.0000 | ORAL_TABLET | Freq: Every day | ORAL | Status: DC

## 2015-05-26 NOTE — Telephone Encounter (Signed)
-----   Message from Francia Greaves sent at 05/26/2015  9:52 AM EDT ----- Regarding: Rx Refill Contact: [phone] Needs birth control refilled, has appt schedule on 11/08 for annual

## 2015-05-26 NOTE — Telephone Encounter (Signed)
I have sent in a refill of Sprintec to patients pharmacy.

## 2015-06-03 ENCOUNTER — Ambulatory Visit: Payer: BC Managed Care – PPO | Admitting: Obstetrics & Gynecology

## 2015-06-17 ENCOUNTER — Encounter: Payer: Self-pay | Admitting: Obstetrics & Gynecology

## 2015-06-17 ENCOUNTER — Ambulatory Visit (INDEPENDENT_AMBULATORY_CARE_PROVIDER_SITE_OTHER): Payer: BC Managed Care – PPO | Admitting: Obstetrics & Gynecology

## 2015-06-17 VITALS — BP 110/79 | HR 54 | Ht 63.0 in | Wt 199.0 lb

## 2015-06-17 DIAGNOSIS — Z3041 Encounter for surveillance of contraceptive pills: Secondary | ICD-10-CM

## 2015-06-17 DIAGNOSIS — Z1151 Encounter for screening for human papillomavirus (HPV): Secondary | ICD-10-CM

## 2015-06-17 DIAGNOSIS — Z01419 Encounter for gynecological examination (general) (routine) without abnormal findings: Secondary | ICD-10-CM

## 2015-06-17 DIAGNOSIS — Z124 Encounter for screening for malignant neoplasm of cervix: Secondary | ICD-10-CM

## 2015-06-17 DIAGNOSIS — Z0000 Encounter for general adult medical examination without abnormal findings: Secondary | ICD-10-CM

## 2015-06-17 MED ORDER — NORGESTIMATE-ETH ESTRADIOL 0.25-35 MG-MCG PO TABS: ORAL_TABLET | ORAL | Status: DC

## 2015-06-17 NOTE — Progress Notes (Signed)
Subjective:    Alexandra Douglas is a 33 y.o. MW P3 (3 girls- 35 yo twins and a 35 yo) female who presents for an annual exam. The patient has no complaints today. The patient is sexually active. GYN screening history: last pap: was normal. The patient wears seatbelts: yes. The patient participates in regular exercise: yes. Has the patient ever been transfused or tattooed?: no. The patient reports that there is not domestic violence in her life.   Menstrual History: OB History    Gravida Para Term Preterm AB TAB SAB Ectopic Multiple Living   2 2 1 1  0 0 0 0 1 3      Menarche age: 89  Patient's last menstrual period was 05/27/2015.    The following portions of the patient's history were reviewed and updated as appropriate: allergies, current medications, past family history, past medical history, past social history, past surgical history and problem list.  Review of Systems Pertinent items noted in HPI and remainder of comprehensive ROS otherwise negative.  Married for 8 years, denies dyspareunia. Has already had flu vaccine this season. 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   Objective:    BP 110/79 mmHg  Pulse 54  Ht 5\' 3"  (1.6 m)  Wt 199 lb (90.266 kg)  BMI 35.26 kg/m2  LMP 05/27/2015  General Appearance:    Alert, cooperative, no distress, appears stated age  Head:    Normocephalic, without obvious abnormality, atraumatic  Eyes:    PERRL, conjunctiva/corneas clear, EOM's intact, fundi    benign, both eyes  Ears:    Normal TM's and external ear canals, both ears  Nose:   Nares normal, septum midline, mucosa normal, no drainage    or sinus tenderness  Throat:   Lips, mucosa, and tongue normal; teeth and gums normal  Neck:   Supple, symmetrical, trachea midline, no adenopathy;    thyroid:  no enlargement/tenderness/nodules; no carotid   bruit or JVD  Back:     Symmetric, no curvature, ROM normal, no CVA tenderness  Lungs:     Clear to  auscultation bilaterally, respirations unlabored  Chest Wall:    No tenderness or deformity   Heart:    Regular rate and rhythm, S1 and S2 normal, no murmur, rub   or gallop  Breast Exam:    No tenderness, masses, or nipple abnormality  Abdomen:     Soft, non-tender, bowel sounds active all four quadrants,    no masses, no organomegaly  Genitalia:    Normal female without lesion, discharge or tenderness, NSSA, NT, mobile, normal adnexal exam     Extremities:   Extremities normal, atraumatic, no cyanosis or edema  Pulses:   2+ and symmetric all extremities  Skin:   Skin color, texture, turgor normal, no rashes or lesions  Lymph nodes:   Cervical, supraclavicular, and axillary nodes normal  Neurologic:   CNII-XII intact, normal strength, sensation and reflexes    throughout  .    Assessment:    Healthy female exam.    Plan:     Breast self exam technique reviewed and patient encouraged to perform self-exam monthly. Thin prep Pap smear.   Refill OCPs (She wants to take them continuously)

## 2015-06-23 LAB — CYTOLOGY - PAP

## 2015-06-25 ENCOUNTER — Other Ambulatory Visit: Payer: Self-pay | Admitting: Family Medicine

## 2015-07-09 ENCOUNTER — Telehealth: Payer: Self-pay | Admitting: *Deleted

## 2015-07-09 NOTE — Telephone Encounter (Signed)
Opened in error

## 2015-07-10 ENCOUNTER — Other Ambulatory Visit: Payer: Self-pay | Admitting: Obstetrics & Gynecology

## 2016-06-03 ENCOUNTER — Other Ambulatory Visit: Payer: Self-pay | Admitting: Family Medicine

## 2016-06-25 ENCOUNTER — Ambulatory Visit: Payer: BC Managed Care – PPO | Admitting: Obstetrics & Gynecology

## 2016-08-13 ENCOUNTER — Ambulatory Visit: Payer: BC Managed Care – PPO | Admitting: Obstetrics & Gynecology

## 2016-09-10 ENCOUNTER — Ambulatory Visit (INDEPENDENT_AMBULATORY_CARE_PROVIDER_SITE_OTHER): Payer: BC Managed Care – PPO | Admitting: Obstetrics and Gynecology

## 2016-09-10 ENCOUNTER — Encounter: Payer: Self-pay | Admitting: Obstetrics and Gynecology

## 2016-09-10 VITALS — BP 106/73 | HR 60 | Resp 18 | Ht 63.0 in | Wt 168.0 lb

## 2016-09-10 DIAGNOSIS — Z0000 Encounter for general adult medical examination without abnormal findings: Secondary | ICD-10-CM | POA: Diagnosis not present

## 2016-09-10 DIAGNOSIS — Z3041 Encounter for surveillance of contraceptive pills: Secondary | ICD-10-CM

## 2016-09-10 DIAGNOSIS — Z01419 Encounter for gynecological examination (general) (routine) without abnormal findings: Secondary | ICD-10-CM | POA: Diagnosis not present

## 2016-09-10 MED ORDER — NORGESTIMATE-ETH ESTRADIOL 0.25-35 MG-MCG PO TABS: ORAL_TABLET | ORAL | 7 refills | Status: DC

## 2016-09-10 NOTE — Progress Notes (Signed)
Subjective:     Alexandra Douglas is a 36 y.o. female [phone] with BMI 29 who is here for a comprehensive physical exam. The patient reports no problems. She is sexually active using OCP for contraception. She denies abnormal bleeding, discharge or pelvic pain  Past Medical History:  Diagnosis Date  . PONV (postoperative nausea and vomiting)   . Preterm labor    Past Surgical History:  Procedure Laterality Date  . CESAREAN SECTION  02/06/2008  . melanoma removal  2014   clear margins  . WISDOM TOOTH EXTRACTION     Family History  Problem Relation Age of Onset  . Arthritis Mother   . Hyperlipidemia Mother   . Heart disease Maternal Grandfather   . Cancer Paternal Grandfather     Throat  . Cancer Paternal Grandmother     intestinal  . Anesthesia problems Neg Hx     Social History   Social History  . Marital status: Married    Spouse name: N/A  . Number of children: N/A  . Years of education: N/A   Occupational History  . Not on file.   Social History Main Topics  . Smoking status: Never Smoker  . Smokeless tobacco: Never Used  . Alcohol use No  . Drug use: No  . Sexual activity: Yes    Partners: Male    Birth control/ protection: OCP, Pill   Other Topics Concern  . Not on file   Social History Narrative  . No narrative on file   Health Maintenance  Topic Date Due  . INFLUENZA VACCINE  02/24/2016  . PAP SMEAR  06/16/2018  . TETANUS/TDAP  01/19/2022  . HIV Screening  Completed       Review of Systems Pertinent items are noted in HPI.   Objective:  Blood pressure 106/73, pulse 60, resp. rate 18, height 5\' 3"  (1.6 m), weight 168 lb (76.2 kg), last menstrual period 08/27/2016.     GENERAL: Well-developed, well-nourished female in no acute distress.  HEENT: Normocephalic, atraumatic. Sclerae anicteric.  NECK: Supple. Normal thyroid.  LUNGS: Clear to auscultation bilaterally.  HEART: Regular rate and rhythm. BREASTS: Symmetric in size. No palpable  masses or lymphadenopathy, skin changes, or nipple drainage. ABDOMEN: Soft, nontender, nondistended. No organomegaly. PELVIC: Normal external female genitalia. Vagina is pink and rugated.  Normal discharge. Normal appearing cervix. Uterus is normal in size. No adnexal mass or tenderness. EXTREMITIES: No cyanosis, clubbing, or edema, 2+ distal pulses.    Assessment:    Healthy female exam.      Plan:    pap smear collected Patient encouraged to perform monthly breast exam Patient will be contacted with results Refill on OCP provided See After Visit Summary for Counseling Recommendations

## 2016-09-16 LAB — CYTOLOGY - PAP
Diagnosis: NEGATIVE
HPV: NOT DETECTED

## 2016-12-26 ENCOUNTER — Ambulatory Visit
Admission: EM | Admit: 2016-12-26 | Discharge: 2016-12-26 | Disposition: A | Discharge status: Home / Self Care | Payer: BC Managed Care – PPO | Attending: Family Medicine | Admitting: Family Medicine

## 2016-12-26 DIAGNOSIS — J069 Acute upper respiratory infection, unspecified: Secondary | ICD-10-CM | POA: Diagnosis not present

## 2016-12-26 DIAGNOSIS — J029 Acute pharyngitis, unspecified: Secondary | ICD-10-CM

## 2016-12-26 LAB — RAPID STREP SCREEN (MED CTR MEBANE ONLY): Streptococcus, Group A Screen (Direct): NEGATIVE

## 2016-12-26 MED ORDER — AMOXICILLIN-POT CLAVULANATE 875-125 MG PO TABS: 1.0000 | ORAL_TABLET | Freq: Two times a day (BID) | ORAL | 0 refills | Status: AC

## 2016-12-26 NOTE — Discharge Instructions (Signed)
-  rapid strep negative but will send for culture -Augmentin 1 tablet twice a day for 7 days. -Continue clear fluids neck -can continue using over-the-counter medications for symptom management -Can use Flonase or Afrin nasal sprays with the spray directed slightly towards the ears to help relieve congestion and fluid in the ears -Can use OTC nasal saline spray to help clear sinuses. -Return to clinic or PCP should symptoms worsen or not improve.

## 2016-12-26 NOTE — ED Triage Notes (Signed)
Pt with sore throat x Wednesday. Some congestion and coughing as well. Pain 5/10

## 2016-12-26 NOTE — ED Provider Notes (Signed)
CSN: [id number]     Arrival date & time 12/26/16  0801 History   None    Chief Complaint  Patient presents with  . Sore Throat   (Consider location/radiation/quality/duration/timing/severity/associated sxs/prior Treatment) Patient is a 36 year old female who presents with complaint of sore throat for several days. Patient also reports cough, congestion, and sinus pain and pressure. Patient states her kids have had strep throat over the last month, with the last one ending about a week ago. Patient ports taken NyQuil last night and has been taking ibuprofen on and off. Patient denies fever, chills, body aches, shortness of breath, abdominal pain, and chest pain. Patient does report occasional flushed feeling in her face.      Past Medical History:  Diagnosis Date  . PONV (postoperative nausea and vomiting)   . Preterm labor    Past Surgical History:  Procedure Laterality Date  . CESAREAN SECTION  02/06/2008  . melanoma removal  2014   clear margins  . WISDOM TOOTH EXTRACTION     Family History  Problem Relation Age of Onset  . Arthritis Mother   . Hyperlipidemia Mother   . Heart disease Maternal Grandfather   . Cancer Paternal Grandfather        Throat  . Cancer Paternal Grandmother        intestinal  . Anesthesia problems Neg Hx    Social History  Substance Use Topics  . Smoking status: Never Smoker  . Smokeless tobacco: Never Used  . Alcohol use No   OB History    Gravida Para Term Preterm AB Living   2 2 1 1  0 3   SAB TAB Ectopic Multiple Live Births   0 0 0 1 2     Review of Systems  Constitutional: Positive for fever. Negative for chills.  HENT: Positive for congestion, postnasal drip, sinus pain, sinus pressure and sore throat.   Eyes: Negative for discharge and redness.  Respiratory: Positive for cough. Negative for shortness of breath.   Cardiovascular: Negative for chest pain.  Gastrointestinal: Negative for abdominal pain, nausea and vomiting.  All  other systems reviewed and are negative.   Allergies  Patient has no known allergies.  Home Medications   Prior to Admission medications   Medication Sig Start Date End Date Taking? Authorizing Provider  acetaminophen (TYLENOL) 500 MG tablet Take 500 mg by mouth every 6 (six) hours as needed. For pain    [provider]  amoxicillin-clavulanate (AUGMENTIN) 875-125 MG tablet Take 1 tablet by mouth every 12 (twelve) hours. 12/26/16 01/02/17  Luvenia Redden, PA-C  ibuprofen (ADVIL,MOTRIN) 600 MG tablet  01/21/12   [provider]  norgestimate-ethinyl estradiol (ORTHO-CYCLEN,SPRINTEC,PREVIFEM) 0.25-35 MG-MCG tablet Take an active pill continuously. This means that she will use more than 1 pack per month. 09/10/16   Constant, Peggy, MD   Meds Ordered and Administered this Visit  Medications - No data to display  BP 98/76 (BP Location: Left Arm)   Pulse 73   Temp 98.2 F (36.8 C) (Oral)   Resp 16   Ht 5\' 3"  (1.6 m)   Wt 175 lb (79.4 kg)   LMP 12/19/2016   SpO2 100%   BMI 31.00 kg/m  No data found.   Physical Exam  Constitutional: She is oriented to person, place, and time. She appears well-developed and well-nourished.  HENT:  Head: Normocephalic and atraumatic.  Right Ear: Hearing and ear canal normal. Tympanic membrane is not injected. A middle ear  effusion is present.  Left Ear: Hearing and ear canal normal. Tympanic membrane is not injected. A middle ear effusion is present.  Nose: Rhinorrhea present.  Mouth/Throat: Uvula is midline. Posterior oropharyngeal erythema present.  Clear post nasal drip.  Eyes: EOM are normal. Pupils are equal, round, and reactive to light.  Neck: Normal range of motion. Neck supple.  Cardiovascular: Normal rate, regular rhythm and normal heart sounds.   Pulmonary/Chest: Effort normal and breath sounds normal.  Abdominal: Soft. Bowel sounds are normal.  Neurological: She is alert and oriented to person, place, and time.  Skin:  Skin is warm and dry.    Urgent Care Course     Procedures (including critical care time)  Labs Review Labs Reviewed  RAPID STREP SCREEN (NOT AT [facility])  CULTURE, GROUP A STREP [facility])    Imaging Review No results found.     MDM   1. Upper respiratory tract infection, unspecified type   2. Pharyngitis, unspecified etiology     New Prescriptions   AMOXICILLIN-CLAVULANATE (AUGMENTIN) 875-125 MG TABLET    Take 1 tablet by mouth every 12 (twelve) hours.   Patient presents with signs and symptoms of sinus congestion and sore throat for several days. Patient reported her children having positive results for strep throat and antibiotic therapy over the last month.  Given sick contacts at home or go ahead and treat her with a course of Augmentin. Recommend patient to continue over-the-counter medications for symptom management. Patient also able to try Flonase or Afrin to help relieve her congestion.  Luvenia Redden, PA-C    Luvenia Redden, PA-C 12/26/16 [phone]

## 2016-12-29 LAB — CULTURE, GROUP A STREP (THRC)

## 2016-12-30 ENCOUNTER — Telehealth: Payer: Self-pay | Admitting: *Deleted

## 2016-12-30 NOTE — Telephone Encounter (Signed)
Patient called requesting strep culture results. Verified DOB, communicated negative strep culture result. Patient confirmed understanding of information.

## 2017-05-07 ENCOUNTER — Other Ambulatory Visit: Payer: Self-pay | Admitting: Family Medicine

## 2017-05-09 ENCOUNTER — Other Ambulatory Visit: Payer: Self-pay

## 2017-05-09 DIAGNOSIS — Z3041 Encounter for surveillance of contraceptive pills: Secondary | ICD-10-CM

## 2017-05-09 MED ORDER — NORGESTIMATE-ETH ESTRADIOL 0.25-35 MG-MCG PO TABS: ORAL_TABLET | ORAL | 7 refills | Status: DC

## 2017-05-09 NOTE — Telephone Encounter (Signed)
Refill on birth control pills. 

## 2017-07-26 NOTE — L&D Delivery Note (Signed)
Patient is a 37 y.o. now G3P4 s/p VBAC at [redacted]w[redacted]d, who was admitted for SOL.  She progressed without augmentation to complete and pushed 1hr 62minutes to deliver.  Cord clamping delayed by several minutes then clamped by CNM and cut by FOB.  Placenta intact and spontaneous, bleeding minimal.  1st laceration repaired without difficulty.  Mom and baby stable prior to transfer to postpartum. She plans on breastfeeding.  Delivery Note At 6:38 AM a viable and healthy female was delivered via Vaginal, Spontaneous (Presentation: OA).  APGAR: 8, 9; weight pending .   Placenta intact and spontaneous, bleeding minimal. 3V Cord  Anesthesia:  Epidural Episiotomy: None Lacerations: 1st degree;Perineal Suture Repair: 3.0 vicryl Est. Blood Loss (mL): 150  Mom to postpartum.  Baby to Couplet care / Skin to Skin.  Lajean Manes CNM 04/07/2018, 7:30 AM

## 2017-07-27 ENCOUNTER — Encounter: Payer: Self-pay | Admitting: Radiology

## 2017-09-22 ENCOUNTER — Ambulatory Visit (INDEPENDENT_AMBULATORY_CARE_PROVIDER_SITE_OTHER): Payer: BC Managed Care – PPO | Admitting: Obstetrics and Gynecology

## 2017-09-22 ENCOUNTER — Encounter: Payer: Self-pay | Admitting: Obstetrics and Gynecology

## 2017-09-22 VITALS — BP 99/63 | HR 86 | Wt 205.0 lb

## 2017-09-22 DIAGNOSIS — O99210 Obesity complicating pregnancy, unspecified trimester: Secondary | ICD-10-CM | POA: Insufficient documentation

## 2017-09-22 DIAGNOSIS — O0991 Supervision of high risk pregnancy, unspecified, first trimester: Secondary | ICD-10-CM

## 2017-09-22 DIAGNOSIS — Z23 Encounter for immunization: Secondary | ICD-10-CM | POA: Diagnosis not present

## 2017-09-22 DIAGNOSIS — O0990 Supervision of high risk pregnancy, unspecified, unspecified trimester: Secondary | ICD-10-CM

## 2017-09-22 DIAGNOSIS — O09521 Supervision of elderly multigravida, first trimester: Secondary | ICD-10-CM

## 2017-09-22 DIAGNOSIS — Z3687 Encounter for antenatal screening for uncertain dates: Secondary | ICD-10-CM | POA: Diagnosis not present

## 2017-09-22 DIAGNOSIS — O26899 Other specified pregnancy related conditions, unspecified trimester: Secondary | ICD-10-CM

## 2017-09-22 DIAGNOSIS — Z113 Encounter for screening for infections with a predominantly sexual mode of transmission: Secondary | ICD-10-CM

## 2017-09-22 DIAGNOSIS — Z98891 History of uterine scar from previous surgery: Secondary | ICD-10-CM

## 2017-09-22 DIAGNOSIS — L989 Disorder of the skin and subcutaneous tissue, unspecified: Secondary | ICD-10-CM

## 2017-09-22 DIAGNOSIS — Z6791 Unspecified blood type, Rh negative: Secondary | ICD-10-CM

## 2017-09-22 DIAGNOSIS — Z8751 Personal history of pre-term labor: Secondary | ICD-10-CM

## 2017-09-22 DIAGNOSIS — O09899 Supervision of other high risk pregnancies, unspecified trimester: Secondary | ICD-10-CM

## 2017-09-22 DIAGNOSIS — O09529 Supervision of elderly multigravida, unspecified trimester: Secondary | ICD-10-CM | POA: Insufficient documentation

## 2017-09-22 DIAGNOSIS — O09211 Supervision of pregnancy with history of pre-term labor, first trimester: Secondary | ICD-10-CM

## 2017-09-22 HISTORY — DX: History of uterine scar from previous surgery: Z98.891

## 2017-09-22 NOTE — Progress Notes (Signed)
Last pap 05/2015 Does not want generic testing Flu shot today

## 2017-09-22 NOTE — Progress Notes (Signed)
DATING AND VIABILITY SONOGRAM   Corrina Steffensen is a 37 y.o. year old G3P1103 with LMP Patient's last menstrual period was 07/22/2017 (lmp unknown). which would correlate to  [redacted]w[redacted]d weeks gestation.  She has regular menstrual cycles.   She is here today for a confirmatory initial sonogram.    GESTATION: SINGLETON     FETAL ACTIVITY:          Heart rate: 178bpm          The fetus is active.   GESTATIONAL AGE AND  BIOMETRICS:  Gestational criteria: Estimated Date of Delivery: 04/28/18 by LMP now at [redacted]w[redacted]d  Previous Scans:0  CROWN RUMP LENGTH 2.32cm         9.0 weeks                                  AVERAGE EGA(BY THIS SCAN):  9.0 weeks  WORKING EDD( LMP ):  04/28/2018     TECHNICIAN COMMENTS:  SLIUP measuring 9.0 wks by CRL with FHR 178bpm   A copy of this report including all images has been saved and backed up to a second source for retrieval if needed. All measures and details of the anatomical scan, placentation, fluid volume and pelvic anatomy are contained in that report.  Marylen Zuk 09/22/2017 9:51 AM

## 2017-09-22 NOTE — Progress Notes (Signed)
New OB Note  09/22/2017   Clinic: [facility]  Chief Complaint: NOB  Transfer of Care Patient: no  History of Present Illness: Ms. Oyster is a 37 y.o. [id number] @ 8/6 weeks ([place] 10/4, based on Patient's last menstrual period was 07/22/2017 (lmp unknown).=8wk u/s).  Preg complicated by has History of preterm delivery; Rh negative state in antepartum period; Precancerous skin lesion; Obesity (BMI 35.0-39.9 without comorbidity); AMA (advanced maternal age) multigravida 93+; History of VBAC; and Obesity in pregnancy on their problem list.   Any events prior to today's visit: no Her periods were: regular She was using oral contraceptives (estrogen/progesterone) when she conceived but she did miss a few days She has Negative signs or symptoms of nausea/vomiting of pregnancy. She has Negative signs or symptoms of miscarriage or preterm labor On any medications around the time she conceived/early pregnancy: combined OCPs  ROS: A 12-point review of systems was performed and negative, except as stated in the above HPI.  OBGYN History: As per HPI. OB History  Gravida Para Term Preterm AB Living  3 2 1 1  0 3  SAB TAB Ectopic Multiple Live Births  0 0 0 1 3    # Outcome Date GA Lbr Len/2nd Weight Sex Delivery Anes PTL Lv  3 Current           2 Term 01/19/12 [redacted]w[redacted]d 17:08 / 02:33 7 lb 7 oz (3.374 kg) F Vag-Spont EPI  LIV     Birth Comments: none  1A Preterm 02/06/08 [redacted]w[redacted]d  2 lb 6 oz (1.077 kg) F CS-LTranv Spinal Y LIV     Birth Comments: TWINS  1B Preterm 02/06/08 [redacted]w[redacted]d  2 lb 4 oz (1.021 kg) F CS-LTranv Spinal Y LIV     Any issues with any prior pregnancies: yes Prior children are healthy, doing well, and without any problems or issues: yes History of pap smears: Yes. Last pap smear 2018 and results were NILM/HPV neg   Past Medical History: Past Medical History:  Diagnosis Date  . PONV (postoperative nausea and vomiting)   . Precancerous skin lesion 07/31/2013    Goes q19m to [facility] in [place]. She states it's always been pre-cancerous.   . Preterm labor     Past Surgical History: Past Surgical History:  Procedure Laterality Date  . CESAREAN SECTION  02/06/2008  . melanoma removal  2014   clear margins  . WISDOM TOOTH EXTRACTION      Family History:  Family History  Problem Relation Age of Onset  . Arthritis Mother   . Hyperlipidemia Mother   . Heart disease Maternal Grandfather   . Cancer Paternal Grandfather        Throat  . Cancer Paternal Grandmother        intestinal  . Anesthesia problems Neg Hx     She denies any history of mental retardation, birth defects or genetic disorders in her or the FOB's history  Social History:  Social History   Socioeconomic History  . Marital status: Married    Spouse name: Not on file  . Number of children: Not on file  . Years of education: Not on file  . Highest education level: Not on file  Social Needs  . Financial resource strain: Not on file  . Food insecurity - worry: Not on file  . Food insecurity - inability: Not on file  . Transportation needs - medical: Not on file  . Transportation needs - non-medical: Not on file  Occupational History  . Not on file  Tobacco Use  . Smoking status: Never Smoker  . Smokeless tobacco: Never Used  Substance and Sexual Activity  . Alcohol use: No  . Drug use: No  . Sexual activity: Yes    Partners: Male    Birth control/protection: Pill  Other Topics Concern  . Not on file  Social History Narrative  . Not on file    Allergy: No Known Allergies  Health Maintenance:  Mammogram Up to Date: not applicable  Current Outpatient Medications: PNV  Physical Exam:   BP 99/63   Pulse 86   Wt 205 lb (93 kg)   LMP 07/22/2017 (LMP Unknown)   BMI 36.31 kg/m  Body mass index is 36.31 kg/m.   Vag. Bleeding: None. Fundal height: not applicable FHTs: 573U  General appearance: Well nourished, well developed female in no  acute distress.  Cardiovascular: S1, S2 normal, no murmur, rub or gallop, regular rate and rhythm Respiratory:  Clear to auscultation bilateral. Normal respiratory effort Abdomen: positive bowel sounds and no masses, hernias; diffusely non tender to palpation, non distended Breasts: pt denies any breast s/s. Neuro/Psych:  Normal mood and affect.  Skin:  Warm and dry.   Laboratory: none  Imaging:  As per RN note; SLIUP c/w LMP  Assessment: pt doing well  Plan: 1. Supervision of high risk pregnancy, antepartum Routine care - Obstetric Panel, Including HIV - Culture, OB Urine - Flu Vaccine QUAD 36+ mos IM (Fluarix, Quad PF) - CMP and Liver - Protein / creatinine ratio, urine - TSH - Cervicovaginal ancillary only - Hemoglobin A1c  2. AMA Patient declines genetics or GC. Follow up anatomy u/s Baseline pre-x, a1c, tsh today  3. H/o VBAC Pt desiring another VBAC.   4. Rh negative Rhogam at 28wks and pp prn  5. H/o pre-cancerous skin lesions Goes q66m to [facility] in [place]. She states it's always been pre-cancerous.   6. H/o PTB G1 early 3rd trimester twins, pprom and c-section. Was on 17p for G2 TSVD. Pt amenable to doing 17p again. Start at 16wks  Problem list reviewed and updated.  Follow up in 4 weeks.  >50% of 20 min visit spent on counseling and coordination of care.     Durene Romans MD Attending Center for [facility])

## 2017-09-23 ENCOUNTER — Other Ambulatory Visit: Payer: Self-pay | Admitting: *Deleted

## 2017-09-23 LAB — OBSTETRIC PANEL, INCLUDING HIV
Antibody Screen: NEGATIVE
BASOS: 0 %
Basophils Absolute: 0 10*3/uL (ref 0.0–0.2)
EOS (ABSOLUTE): 0.1 10*3/uL (ref 0.0–0.4)
EOS: 2 %
HEMATOCRIT: 38.5 % (ref 34.0–46.6)
HEMOGLOBIN: 13.1 g/dL (ref 11.1–15.9)
HIV SCREEN 4TH GENERATION: NONREACTIVE
Hepatitis B Surface Ag: NEGATIVE
Immature Grans (Abs): 0 10*3/uL (ref 0.0–0.1)
Immature Granulocytes: 0 %
LYMPHS ABS: 2.4 10*3/uL (ref 0.7–3.1)
Lymphs: 32 %
MCH: 30 pg (ref 26.6–33.0)
MCHC: 34 g/dL (ref 31.5–35.7)
MCV: 88 fL (ref 79–97)
MONOCYTES: 5 %
Monocytes Absolute: 0.4 10*3/uL (ref 0.1–0.9)
NEUTROS ABS: 4.7 10*3/uL (ref 1.4–7.0)
Neutrophils: 61 %
Platelets: 265 10*3/uL (ref 150–379)
RBC: 4.36 x10E6/uL (ref 3.77–5.28)
RDW: 12.8 % (ref 12.3–15.4)
RH TYPE: NEGATIVE
RPR: NONREACTIVE
RUBELLA: 5.02 {index} (ref >0.99)
WBC: 7.6 10*3/uL (ref 3.4–10.8)

## 2017-09-23 LAB — CMP AND LIVER
ALK PHOS: 70 IU/L (ref 39–117)
ALT: 22 IU/L (ref 0–32)
AST: 27 IU/L (ref 0–40)
Albumin: 4.3 g/dL (ref 3.5–5.5)
BILIRUBIN TOTAL: 0.4 mg/dL (ref 0.0–1.2)
BILIRUBIN, DIRECT: 0.12 mg/dL (ref 0.00–0.40)
BUN: 8 mg/dL (ref 6–20)
CHLORIDE: 101 mmol/L (ref 96–106)
CO2: 23 mmol/L (ref 20–29)
Calcium: 8.7 mg/dL (ref 8.7–10.2)
Creatinine, Ser: 0.55 mg/dL — ABNORMAL LOW (ref 0.57–1.00)
GFR calc Af Amer: 139 mL/min/{1.73_m2} (ref >59)
GFR calc non Af Amer: 120 mL/min/{1.73_m2} (ref >59)
Glucose: 108 mg/dL — ABNORMAL HIGH (ref 65–99)
Potassium: 4.1 mmol/L (ref 3.5–5.2)
SODIUM: 138 mmol/L (ref 134–144)
TOTAL PROTEIN: 6.8 g/dL (ref 6.0–8.5)

## 2017-09-23 LAB — PROTEIN / CREATININE RATIO, URINE
Creatinine, Urine: 21 mg/dL
Protein, Ur: 4.1 mg/dL
Protein/Creat Ratio: 195 mg/g creat (ref 0–200)

## 2017-09-23 LAB — TSH: TSH: 2.12 u[IU]/mL (ref 0.450–4.500)

## 2017-09-23 LAB — HEMOGLOBIN A1C
ESTIMATED AVERAGE GLUCOSE: 108 mg/dL
Hgb A1c MFr Bld: 5.4 % (ref 4.8–5.6)

## 2017-09-23 MED ORDER — HYDROXYPROGESTERONE CAPROATE 275 MG/1.1ML ~~LOC~~ SOAJ: 275.0000 mg | SUBCUTANEOUS | 19 refills | Status: DC

## 2017-09-24 LAB — URINE CULTURE, OB REFLEX

## 2017-09-24 LAB — CULTURE, OB URINE

## 2017-09-26 ENCOUNTER — Encounter: Payer: Self-pay | Admitting: Obstetrics and Gynecology

## 2017-09-26 LAB — CERVICOVAGINAL ANCILLARY ONLY
Chlamydia: NEGATIVE
Neisseria Gonorrhea: NEGATIVE
Trichomonas: NEGATIVE

## 2017-10-21 ENCOUNTER — Encounter: Payer: Self-pay | Admitting: Obstetrics & Gynecology

## 2017-10-21 ENCOUNTER — Encounter: Payer: Self-pay | Admitting: *Deleted

## 2017-10-21 ENCOUNTER — Ambulatory Visit (INDEPENDENT_AMBULATORY_CARE_PROVIDER_SITE_OTHER): Payer: BC Managed Care – PPO | Admitting: Obstetrics & Gynecology

## 2017-10-21 VITALS — BP 121/80 | HR 67 | Wt 210.0 lb

## 2017-10-21 DIAGNOSIS — Z8751 Personal history of pre-term labor: Secondary | ICD-10-CM

## 2017-10-21 DIAGNOSIS — O09522 Supervision of elderly multigravida, second trimester: Secondary | ICD-10-CM

## 2017-10-21 DIAGNOSIS — O0990 Supervision of high risk pregnancy, unspecified, unspecified trimester: Secondary | ICD-10-CM

## 2017-10-21 DIAGNOSIS — O99210 Obesity complicating pregnancy, unspecified trimester: Secondary | ICD-10-CM

## 2017-10-21 NOTE — Progress Notes (Signed)
   PRENATAL VISIT NOTE  Subjective:  Alexandra Douglas is a 37 y.o. G3P1103 at [redacted]w[redacted]d being seen today for ongoing prenatal care.  She is currently monitored for the following issues for this high-risk pregnancy and has History of preterm delivery; Supervision of high risk pregnancy, antepartum; Rh negative state in antepartum period; Precancerous skin lesion; Obesity (BMI 35.0-39.9 without comorbidity); AMA (advanced maternal age) multigravida 45+; History of VBAC; and Obesity in pregnancy on their problem list.  Patient reports no complaints.  Contractions: Not present. Vag. Bleeding: None.  Movement: Absent. Denies leaking of fluid.   The following portions of the patient's history were reviewed and updated as appropriate: allergies, current medications, past family history, past medical history, past social history, past surgical history and problem list. Problem list updated.  Objective:   Vitals:   10/21/17 1020  BP: 121/80  Pulse: 67  Weight: 210 lb (95.3 kg)    Fetal Status: Fetal Heart Rate (bpm): 158   Movement: Absent     General:  Alert, oriented and cooperative. Patient is in no acute distress.  Skin: Skin is warm and dry. No rash noted.   Cardiovascular: Normal heart rate noted  Respiratory: Normal respiratory effort, no problems with respiration noted  Abdomen: Soft, gravid, appropriate for gestational age.  Pain/Pressure: Absent     Pelvic: Cervical exam deferred        Extremities: Normal range of motion.  Edema: None  Mental Status:  Normal mood and affect. Normal behavior. Normal judgment and thought content.   Assessment and Plan:  Pregnancy: G3P1103 at [redacted]w[redacted]d  1. Supervision of high risk pregnancy, antepartum   2. Elderly multigravida in second trimester - declines genetics  3. History of preterm delivery - Start 17 P at 16 weeks  4. Obesity in pregnancy   Preterm labor symptoms and general obstetric precautions including but not limited to vaginal  bleeding, contractions, leaking of fluid and fetal movement were reviewed in detail with the patient. Please refer to After Visit Summary for other counseling recommendations.  Return in about 3 weeks (around 11/11/2017) for Come in at 16 weeks to start 17 P.   Emily Filbert, MD

## 2017-10-25 ENCOUNTER — Other Ambulatory Visit: Payer: Self-pay | Admitting: *Deleted

## 2017-10-25 ENCOUNTER — Encounter: Payer: Self-pay | Admitting: *Deleted

## 2017-10-25 ENCOUNTER — Other Ambulatory Visit (INDEPENDENT_AMBULATORY_CARE_PROVIDER_SITE_OTHER): Payer: BC Managed Care – PPO | Admitting: *Deleted

## 2017-10-25 DIAGNOSIS — O09522 Supervision of elderly multigravida, second trimester: Secondary | ICD-10-CM

## 2017-10-25 MED ORDER — HYDROXYPROGESTERONE CAPROATE 250 MG/ML IM OIL: 250.0000 mg | TOPICAL_OIL | Freq: Once | INTRAMUSCULAR | 0 refills | Status: DC

## 2017-10-25 MED ORDER — HYDROXYPROGESTERONE CAPROATE 250 MG/ML IM OIL: 250.0000 mg | TOPICAL_OIL | INTRAMUSCULAR | 20 refills | Status: DC

## 2017-10-25 MED ORDER — HYDROXYPROGESTERONE CAPROATE 250 MG/ML IM OIL: 250.0000 mg | TOPICAL_OIL | Freq: Once | INTRAMUSCULAR | 20 refills | Status: DC

## 2017-10-25 NOTE — Progress Notes (Signed)
Pt here for panorama test. [facility] lab. Pt tolerated well.

## 2017-11-10 ENCOUNTER — Ambulatory Visit (INDEPENDENT_AMBULATORY_CARE_PROVIDER_SITE_OTHER): Payer: BC Managed Care – PPO

## 2017-11-10 VITALS — BP 100/71 | HR 72

## 2017-11-10 DIAGNOSIS — O09212 Supervision of pregnancy with history of pre-term labor, second trimester: Secondary | ICD-10-CM

## 2017-11-10 DIAGNOSIS — O0990 Supervision of high risk pregnancy, unspecified, unspecified trimester: Secondary | ICD-10-CM

## 2017-11-10 MED ORDER — HYDROXYPROGESTERONE CAPROATE 250 MG/ML IM OIL
250.0000 mg | TOPICAL_OIL | Freq: Once | INTRAMUSCULAR | Status: AC
  Administered 2017-11-10: 250 mg via INTRAMUSCULAR

## 2017-11-10 NOTE — Progress Notes (Signed)
Patient presented to the office today for 17-p injection received in left gluteal IM. Patient tolerated well and will follow up next week for next injection.

## 2017-11-10 NOTE — Progress Notes (Signed)
Patient seen and assessed by nursing staff.  Agree with documentation and plan.  

## 2017-11-18 ENCOUNTER — Encounter: Payer: Self-pay | Admitting: Obstetrics and Gynecology

## 2017-11-18 ENCOUNTER — Ambulatory Visit (INDEPENDENT_AMBULATORY_CARE_PROVIDER_SITE_OTHER): Payer: BC Managed Care – PPO | Admitting: Obstetrics and Gynecology

## 2017-11-18 VITALS — BP 106/66 | HR 75 | Wt 212.0 lb

## 2017-11-18 DIAGNOSIS — O26899 Other specified pregnancy related conditions, unspecified trimester: Secondary | ICD-10-CM

## 2017-11-18 DIAGNOSIS — O0990 Supervision of high risk pregnancy, unspecified, unspecified trimester: Secondary | ICD-10-CM

## 2017-11-18 DIAGNOSIS — Z98891 History of uterine scar from previous surgery: Secondary | ICD-10-CM

## 2017-11-18 DIAGNOSIS — O26892 Other specified pregnancy related conditions, second trimester: Secondary | ICD-10-CM

## 2017-11-18 DIAGNOSIS — O09522 Supervision of elderly multigravida, second trimester: Secondary | ICD-10-CM

## 2017-11-18 DIAGNOSIS — O99210 Obesity complicating pregnancy, unspecified trimester: Secondary | ICD-10-CM

## 2017-11-18 DIAGNOSIS — Z6791 Unspecified blood type, Rh negative: Secondary | ICD-10-CM

## 2017-11-18 DIAGNOSIS — O0992 Supervision of high risk pregnancy, unspecified, second trimester: Secondary | ICD-10-CM

## 2017-11-18 DIAGNOSIS — O99212 Obesity complicating pregnancy, second trimester: Secondary | ICD-10-CM

## 2017-11-18 DIAGNOSIS — O09212 Supervision of pregnancy with history of pre-term labor, second trimester: Secondary | ICD-10-CM

## 2017-11-18 DIAGNOSIS — Z8751 Personal history of pre-term labor: Secondary | ICD-10-CM

## 2017-11-18 DIAGNOSIS — E669 Obesity, unspecified: Secondary | ICD-10-CM

## 2017-11-18 MED ORDER — HYDROXYPROGESTERONE CAPROATE 250 MG/ML IM OIL
250.0000 mg | TOPICAL_OIL | INTRAMUSCULAR | Status: AC
  Administered 2017-11-18 – 2018-03-31 (×14): 250 mg via INTRAMUSCULAR

## 2017-11-18 NOTE — Progress Notes (Signed)
Prenatal Visit Note Date: 11/18/2017 Clinic: [facility]  Subjective:  Alexandra Douglas is a 37 y.o. [phone] at [redacted]w[redacted]d being seen today for ongoing prenatal care.  She is currently monitored for the following issues for this high-risk pregnancy and has History of preterm delivery; Supervision of high risk pregnancy, antepartum; Rh negative state in antepartum period; Precancerous skin lesion; Obesity (BMI 35.0-39.9 without comorbidity); AMA (advanced maternal age) multigravida 34+; History of VBAC; and Obesity in pregnancy on their problem list.  Patient reports no complaints.   Contractions: Not present. Vag. Bleeding: None.  Movement: Present. Denies leaking of fluid.   The following portions of the patient's history were reviewed and updated as appropriate: allergies, current medications, past family history, past medical history, past social history, past surgical history and problem list. Problem list updated.  Objective:   Vitals:   11/18/17 0903  BP: 106/66  Pulse: 75  Weight: 212 lb (96.2 kg)    Fetal Status: Fetal Heart Rate (bpm): 150   Movement: Present     General:  Alert, oriented and cooperative. Patient is in no acute distress.  Skin: Skin is warm and dry. No rash noted.   Cardiovascular: Normal heart rate noted  Respiratory: Normal respiratory effort, no problems with respiration noted  Abdomen: Soft, gravid, appropriate for gestational age. Pain/Pressure: Absent     Pelvic:  Cervical exam deferred        Extremities: Normal range of motion.  Edema: None  Mental Status: Normal mood and affect. Normal behavior. Normal judgment and thought content.   Urinalysis:      Assessment and Plan:  Pregnancy: G3P1103 at [redacted]w[redacted]d  1. Multigravida of advanced maternal age in second trimester Routine care. - [place] MFM OB DETAIL +14 WK; Future  2. History of VBAC D/w pt more later in pregnancy  3. Obesity in pregnancy Weight gain approx 5lbs  4. History of  preterm delivery 17p today. qwk until 37wks  5. Supervision of high risk pregnancy, antepartum Declines afp - [place] MFM OB DETAIL +14 WK; Future  6. Rh negative state in antepartum period Rhogam at 28wks  7. Obesity (BMI 35.0-39.9 without comorbidity)  Preterm labor symptoms and general obstetric precautions including but not limited to vaginal bleeding, contractions, leaking of fluid and fetal movement were reviewed in detail with the patient. Please refer to After Visit Summary for other counseling recommendations.  Return in about 1 week (around 11/25/2017) for 17p and every week. 3-4wk rob.   Aletha Halim, MD

## 2017-11-24 ENCOUNTER — Ambulatory Visit (INDEPENDENT_AMBULATORY_CARE_PROVIDER_SITE_OTHER): Payer: BC Managed Care – PPO

## 2017-11-24 VITALS — BP 110/72 | HR 72

## 2017-11-24 DIAGNOSIS — O0990 Supervision of high risk pregnancy, unspecified, unspecified trimester: Secondary | ICD-10-CM

## 2017-11-24 DIAGNOSIS — O09212 Supervision of pregnancy with history of pre-term labor, second trimester: Secondary | ICD-10-CM | POA: Diagnosis not present

## 2017-11-24 NOTE — Progress Notes (Signed)
Patient presented to the office today for her 17- injection given in left gluteal IM. Patient tolerated well and will follow up in one week for her next injection.

## 2017-11-24 NOTE — Progress Notes (Signed)
I have reviewed the chart and agree with nursing staff's documentation of this patient's encounter.  Verita Schneiders, MD 11/24/2017 9:12 AM

## 2017-12-02 ENCOUNTER — Other Ambulatory Visit: Payer: Self-pay | Admitting: Obstetrics and Gynecology

## 2017-12-02 ENCOUNTER — Ambulatory Visit (INDEPENDENT_AMBULATORY_CARE_PROVIDER_SITE_OTHER): Payer: BC Managed Care – PPO

## 2017-12-02 ENCOUNTER — Ambulatory Visit: Payer: BC Managed Care – PPO

## 2017-12-02 ENCOUNTER — Ambulatory Visit (HOSPITAL_COMMUNITY)
Admission: RE | Admit: 2017-12-02 | Discharge: 2017-12-02 | Disposition: A | Discharge status: Home / Self Care | Payer: BC Managed Care – PPO | Source: Ambulatory Visit | Attending: Obstetrics and Gynecology | Admitting: Obstetrics and Gynecology

## 2017-12-02 ENCOUNTER — Encounter (HOSPITAL_COMMUNITY): Payer: Self-pay

## 2017-12-02 DIAGNOSIS — O09899 Supervision of other high risk pregnancies, unspecified trimester: Secondary | ICD-10-CM

## 2017-12-02 DIAGNOSIS — O09522 Supervision of elderly multigravida, second trimester: Secondary | ICD-10-CM | POA: Insufficient documentation

## 2017-12-02 DIAGNOSIS — O34219 Maternal care for unspecified type scar from previous cesarean delivery: Secondary | ICD-10-CM | POA: Insufficient documentation

## 2017-12-02 DIAGNOSIS — O09219 Supervision of pregnancy with history of pre-term labor, unspecified trimester: Secondary | ICD-10-CM

## 2017-12-02 DIAGNOSIS — Z8751 Personal history of pre-term labor: Secondary | ICD-10-CM

## 2017-12-02 DIAGNOSIS — Z3A19 19 weeks gestation of pregnancy: Secondary | ICD-10-CM

## 2017-12-02 DIAGNOSIS — O0990 Supervision of high risk pregnancy, unspecified, unspecified trimester: Secondary | ICD-10-CM

## 2017-12-02 DIAGNOSIS — O09212 Supervision of pregnancy with history of pre-term labor, second trimester: Secondary | ICD-10-CM | POA: Diagnosis not present

## 2017-12-02 DIAGNOSIS — Z363 Encounter for antenatal screening for malformations: Secondary | ICD-10-CM

## 2017-12-02 MED ORDER — HYDROXYPROGESTERONE CAPROATE 250 MG/ML IM OIL
250.0000 mg | TOPICAL_OIL | Freq: Once | INTRAMUSCULAR | Status: AC
  Administered 2017-12-02: 250 mg via INTRAMUSCULAR

## 2017-12-02 NOTE — Progress Notes (Addendum)
Pt here for 17p inj. Inj given in RUOQ. Pt tolerated well. Refill called in to pharmacy. Alexandra Douglas will be delivered on 5/16

## 2017-12-05 ENCOUNTER — Other Ambulatory Visit (HOSPITAL_COMMUNITY): Payer: Self-pay | Admitting: *Deleted

## 2017-12-05 DIAGNOSIS — O09522 Supervision of elderly multigravida, second trimester: Secondary | ICD-10-CM

## 2017-12-05 NOTE — Progress Notes (Signed)
I have reviewed the chart and agree with nursing staff's documentation of this patient's encounter.  Aletha Halim, MD 12/05/2017 2:01 PM

## 2017-12-09 ENCOUNTER — Ambulatory Visit (INDEPENDENT_AMBULATORY_CARE_PROVIDER_SITE_OTHER): Payer: BC Managed Care – PPO

## 2017-12-09 ENCOUNTER — Ambulatory Visit: Payer: BC Managed Care – PPO

## 2017-12-09 VITALS — BP 95/62 | HR 72

## 2017-12-09 DIAGNOSIS — Z8751 Personal history of pre-term labor: Secondary | ICD-10-CM

## 2017-12-09 DIAGNOSIS — O09212 Supervision of pregnancy with history of pre-term labor, second trimester: Secondary | ICD-10-CM | POA: Diagnosis not present

## 2017-12-09 NOTE — Progress Notes (Signed)
Patient presented to the office for 17-P injection received in left gluteal. Patient tolerated well and will follow up next week for next injection.

## 2017-12-16 ENCOUNTER — Ambulatory Visit (INDEPENDENT_AMBULATORY_CARE_PROVIDER_SITE_OTHER): Payer: BC Managed Care – PPO | Admitting: Obstetrics and Gynecology

## 2017-12-16 VITALS — BP 101/66 | HR 73 | Wt 216.6 lb

## 2017-12-16 DIAGNOSIS — O09212 Supervision of pregnancy with history of pre-term labor, second trimester: Secondary | ICD-10-CM

## 2017-12-16 DIAGNOSIS — O99212 Obesity complicating pregnancy, second trimester: Secondary | ICD-10-CM

## 2017-12-16 DIAGNOSIS — O0990 Supervision of high risk pregnancy, unspecified, unspecified trimester: Secondary | ICD-10-CM

## 2017-12-16 DIAGNOSIS — O26892 Other specified pregnancy related conditions, second trimester: Secondary | ICD-10-CM

## 2017-12-16 DIAGNOSIS — O09522 Supervision of elderly multigravida, second trimester: Secondary | ICD-10-CM

## 2017-12-16 DIAGNOSIS — Z6791 Unspecified blood type, Rh negative: Secondary | ICD-10-CM

## 2017-12-16 DIAGNOSIS — O99210 Obesity complicating pregnancy, unspecified trimester: Secondary | ICD-10-CM

## 2017-12-16 DIAGNOSIS — O26899 Other specified pregnancy related conditions, unspecified trimester: Secondary | ICD-10-CM

## 2017-12-16 DIAGNOSIS — Z98891 History of uterine scar from previous surgery: Secondary | ICD-10-CM

## 2017-12-16 DIAGNOSIS — E669 Obesity, unspecified: Secondary | ICD-10-CM

## 2017-12-16 DIAGNOSIS — O0992 Supervision of high risk pregnancy, unspecified, second trimester: Secondary | ICD-10-CM

## 2017-12-16 DIAGNOSIS — Z8751 Personal history of pre-term labor: Secondary | ICD-10-CM

## 2017-12-16 MED ORDER — HYDROXYPROGESTERONE CAPROATE 275 MG/1.1ML ~~LOC~~ SOAJ: 275.0000 mg | Freq: Once | SUBCUTANEOUS | Status: DC

## 2017-12-16 NOTE — Progress Notes (Addendum)
Prenatal Visit Note Date: 12/16/2017 Clinic: [facility]  Subjective:  Alexandra Douglas is a 37 y.o. [id number] at [redacted]w[redacted]d being seen today for ongoing prenatal care.  She is currently monitored for the following issues for this high-risk pregnancy and has History of preterm delivery; Supervision of high risk pregnancy, antepartum; Rh negative state in antepartum period; Precancerous skin lesion; Obesity (BMI 35.0-39.9 without comorbidity); AMA (advanced maternal age) multigravida 64+; History of VBAC; and Obesity in pregnancy on their problem list.  Patient reports no complaints.   Contractions: Not present. Vag. Bleeding: None.  Movement: Present. Denies leaking of fluid.   The following portions of the patient's history were reviewed and updated as appropriate: allergies, current medications, past family history, past medical history, past social history, past surgical history and problem list. Problem list updated.  Objective:   Vitals:   12/16/17 0827  BP: 101/66  Pulse: 73  Weight: 216 lb 9.6 oz (98.2 kg)    Fetal Status: Fetal Heart Rate (bpm): 152   Movement: Present     General:  Alert, oriented and cooperative. Patient is in no acute distress.  Skin: Skin is warm and dry. No rash noted.   Cardiovascular: Normal heart rate noted  Respiratory: Normal respiratory effort, no problems with respiration noted  Abdomen: Soft, gravid, appropriate for gestational age. Pain/Pressure: Absent     Pelvic:  Cervical exam deferred        Extremities: Normal range of motion.  Edema: None  Mental Status: Normal mood and affect. Normal behavior. Normal judgment and thought content.   Urinalysis:      Assessment and Plan:  Pregnancy: G3P1103 at [redacted]w[redacted]d  1. Supervision of high risk pregnancy, antepartum Routine care. Has f/u anatomy already scheduled.  - HYDROXYprogesterone Caproate SOAJ 275 mg  2. History of VBAC D/w pt more early 3rd trimester  3. Obesity in  pregnancy 11lbs weight gain  4. Obesity (BMI 35.0-39.9 without comorbidity)  5. Rh negative state in antepartum period Antibody screen and rhogam at 28wks  6. Multigravida of advanced maternal age in second trimester No issues  7. History of preterm delivery 17p today, continue qwk  Preterm labor symptoms and general obstetric precautions including but not limited to vaginal bleeding, contractions, leaking of fluid and fetal movement were reviewed in detail with the patient. Please refer to After Visit Summary for other counseling recommendations.  Return in about 1 month (around 01/13/2018) for rob.   Aletha Halim, MD

## 2017-12-23 ENCOUNTER — Ambulatory Visit (INDEPENDENT_AMBULATORY_CARE_PROVIDER_SITE_OTHER): Payer: BC Managed Care – PPO

## 2017-12-23 ENCOUNTER — Ambulatory Visit: Payer: BC Managed Care – PPO

## 2017-12-23 VITALS — BP 100/67 | HR 72

## 2017-12-23 DIAGNOSIS — O0990 Supervision of high risk pregnancy, unspecified, unspecified trimester: Secondary | ICD-10-CM

## 2017-12-23 DIAGNOSIS — O09212 Supervision of pregnancy with history of pre-term labor, second trimester: Secondary | ICD-10-CM | POA: Diagnosis not present

## 2017-12-23 NOTE — Progress Notes (Signed)
Patient presented to the office today 17 p-injection. 250 mg given in left gluteal. Patient tolerated well.

## 2017-12-26 NOTE — Progress Notes (Signed)
I have reviewed this chart and agree with the RN/CMA assessment and management.    Ryland Smoots C Saber Dickerman, MD, FACOG Attending Physician, [facility]  

## 2017-12-30 ENCOUNTER — Ambulatory Visit: Payer: BC Managed Care – PPO

## 2017-12-30 ENCOUNTER — Encounter (HOSPITAL_COMMUNITY): Payer: Self-pay

## 2017-12-30 ENCOUNTER — Ambulatory Visit (INDEPENDENT_AMBULATORY_CARE_PROVIDER_SITE_OTHER): Payer: BC Managed Care – PPO

## 2017-12-30 ENCOUNTER — Other Ambulatory Visit (HOSPITAL_COMMUNITY): Payer: Self-pay | Admitting: Maternal and Fetal Medicine

## 2017-12-30 ENCOUNTER — Ambulatory Visit (HOSPITAL_COMMUNITY)
Admission: RE | Admit: 2017-12-30 | Discharge: 2017-12-30 | Disposition: A | Discharge status: Home / Self Care | Payer: BC Managed Care – PPO | Source: Ambulatory Visit | Attending: Family Medicine | Admitting: Family Medicine

## 2017-12-30 VITALS — BP 94/66 | HR 72

## 2017-12-30 DIAGNOSIS — O09522 Supervision of elderly multigravida, second trimester: Secondary | ICD-10-CM | POA: Diagnosis not present

## 2017-12-30 DIAGNOSIS — O09219 Supervision of pregnancy with history of pre-term labor, unspecified trimester: Secondary | ICD-10-CM

## 2017-12-30 DIAGNOSIS — O34219 Maternal care for unspecified type scar from previous cesarean delivery: Secondary | ICD-10-CM

## 2017-12-30 DIAGNOSIS — Z3A23 23 weeks gestation of pregnancy: Secondary | ICD-10-CM | POA: Insufficient documentation

## 2017-12-30 DIAGNOSIS — Z3689 Encounter for other specified antenatal screening: Secondary | ICD-10-CM | POA: Insufficient documentation

## 2017-12-30 DIAGNOSIS — O0990 Supervision of high risk pregnancy, unspecified, unspecified trimester: Secondary | ICD-10-CM

## 2017-12-30 DIAGNOSIS — O09213 Supervision of pregnancy with history of pre-term labor, third trimester: Secondary | ICD-10-CM

## 2017-12-30 DIAGNOSIS — O09212 Supervision of pregnancy with history of pre-term labor, second trimester: Secondary | ICD-10-CM | POA: Insufficient documentation

## 2017-12-30 DIAGNOSIS — O09899 Supervision of other high risk pregnancies, unspecified trimester: Secondary | ICD-10-CM

## 2017-12-30 NOTE — Progress Notes (Signed)
Patient presented to the office today for 17-p injection given in right gluteal.  Patient tolerated well and will follow up next week.

## 2018-01-01 NOTE — Progress Notes (Signed)
Patient seen and assessed by nursing staff.  Agree with documentation and plan.  

## 2018-01-06 ENCOUNTER — Ambulatory Visit (INDEPENDENT_AMBULATORY_CARE_PROVIDER_SITE_OTHER): Payer: BC Managed Care – PPO | Admitting: *Deleted

## 2018-01-06 ENCOUNTER — Ambulatory Visit: Payer: BC Managed Care – PPO

## 2018-01-06 DIAGNOSIS — O09212 Supervision of pregnancy with history of pre-term labor, second trimester: Secondary | ICD-10-CM | POA: Diagnosis not present

## 2018-01-06 DIAGNOSIS — Z8751 Personal history of pre-term labor: Secondary | ICD-10-CM

## 2018-01-06 NOTE — Progress Notes (Signed)
FHR checked for 1 minute - no abnormalities noted - FHR=140bpm

## 2018-01-13 ENCOUNTER — Encounter: Payer: BC Managed Care – PPO | Admitting: Obstetrics & Gynecology

## 2018-01-13 ENCOUNTER — Ambulatory Visit (INDEPENDENT_AMBULATORY_CARE_PROVIDER_SITE_OTHER): Payer: BC Managed Care – PPO | Admitting: Obstetrics & Gynecology

## 2018-01-13 VITALS — BP 102/69 | HR 72 | Wt 201.0 lb

## 2018-01-13 DIAGNOSIS — O0990 Supervision of high risk pregnancy, unspecified, unspecified trimester: Secondary | ICD-10-CM

## 2018-01-13 DIAGNOSIS — O99210 Obesity complicating pregnancy, unspecified trimester: Secondary | ICD-10-CM

## 2018-01-13 DIAGNOSIS — O09522 Supervision of elderly multigravida, second trimester: Secondary | ICD-10-CM

## 2018-01-13 DIAGNOSIS — O99212 Obesity complicating pregnancy, second trimester: Secondary | ICD-10-CM

## 2018-01-13 DIAGNOSIS — Z8751 Personal history of pre-term labor: Secondary | ICD-10-CM

## 2018-01-13 DIAGNOSIS — O0992 Supervision of high risk pregnancy, unspecified, second trimester: Secondary | ICD-10-CM

## 2018-01-13 DIAGNOSIS — O09212 Supervision of pregnancy with history of pre-term labor, second trimester: Secondary | ICD-10-CM

## 2018-01-13 DIAGNOSIS — Z98891 History of uterine scar from previous surgery: Secondary | ICD-10-CM

## 2018-01-13 MED ORDER — HYDROXYPROGESTERONE CAPROATE 250 MG/ML IM OIL
250.0000 mg | TOPICAL_OIL | Freq: Once | INTRAMUSCULAR | Status: AC
  Administered 2018-01-13: 250 mg via INTRAMUSCULAR

## 2018-01-13 NOTE — Progress Notes (Signed)
   PRENATAL VISIT NOTE  Subjective:  Alexandra Douglas is a 37 y.o. G3P1103 at [redacted]w[redacted]d being seen today for ongoing prenatal care.  She is currently monitored for the following issues for this high-risk pregnancy and has History of preterm delivery; Supervision of high risk pregnancy, antepartum; Rh negative state in antepartum period; Precancerous skin lesion; Obesity (BMI 35.0-39.9 without comorbidity); AMA (advanced maternal age) multigravida 73+; History of VBAC; and Obesity in pregnancy on their problem list.  Patient reports no complaints.  Contractions: Not present. Vag. Bleeding: None.  Movement: Present. Denies leaking of fluid.   The following portions of the patient's history were reviewed and updated as appropriate: allergies, current medications, past family history, past medical history, past social history, past surgical history and problem list. Problem list updated.  Objective:   Vitals:   01/13/18 1058  BP: 102/69  Pulse: 72  Weight: 201 lb (91.2 kg)    Fetal Status: Fetal Heart Rate (bpm): 140   Movement: Present     General:  Alert, oriented and cooperative. Patient is in no acute distress.  Skin: Skin is warm and dry. No rash noted.   Cardiovascular: Normal heart rate noted  Respiratory: Normal respiratory effort, no problems with respiration noted  Abdomen: Soft, gravid, appropriate for gestational age.  Pain/Pressure: Absent     Pelvic: Cervical exam deferred        Extremities: Normal range of motion.  Edema: None  Mental Status: Normal mood and affect. Normal behavior. Normal judgment and thought content.   Assessment and Plan:  Pregnancy: G3P1103 at [redacted]w[redacted]d  1. History of preterm delivery  - hydroxyprogesterone caproate (MAKENA) 250 mg/mL injection 250 mg  2. Supervision of high risk pregnancy, antepartum   3. Obesity in pregnancy   4. History of VBAC   5. Multigravida of advanced maternal age in second trimester -husband planning  vasectomy  Preterm labor symptoms and general obstetric precautions including but not limited to vaginal bleeding, contractions, leaking of fluid and fetal movement were reviewed in detail with the patient. Please refer to After Visit Summary for other counseling recommendations.  Return in about 2 weeks (around 01/27/2018) for weekly 17 P,  2 hour GTT in 2 weeks.  Future Appointments  Date Time Provider [place]  01/20/2018  8:00 AM [facility] NURSE [facility]  01/27/2018  8:00 AM [facility] NURSE [facility]  02/10/2018  8:15 AM Aletha Halim, MD [facility]  02/17/2018  8:00 AM [facility] NURSE [facility]    Emily Filbert, MD

## 2018-01-20 ENCOUNTER — Ambulatory Visit (INDEPENDENT_AMBULATORY_CARE_PROVIDER_SITE_OTHER): Payer: BC Managed Care – PPO

## 2018-01-20 VITALS — BP 93/65 | HR 73

## 2018-01-20 DIAGNOSIS — O09212 Supervision of pregnancy with history of pre-term labor, second trimester: Secondary | ICD-10-CM | POA: Diagnosis not present

## 2018-01-20 DIAGNOSIS — Z8751 Personal history of pre-term labor: Secondary | ICD-10-CM

## 2018-01-20 MED ORDER — HYDROXYPROGESTERONE CAPROATE 250 MG/ML IM OIL
250.0000 mg | TOPICAL_OIL | Freq: Once | INTRAMUSCULAR | Status: AC
  Administered 2018-01-20: 250 mg via INTRAMUSCULAR

## 2018-01-20 NOTE — Progress Notes (Signed)
I have reviewed the chart and agree with nursing staff's documentation of this patient's encounter.  Verita Schneiders, MD 01/20/2018 10:30 AM

## 2018-01-20 NOTE — Progress Notes (Signed)
Pt here for 17p. Inj given RUOQ. Pt tolerated well. FHR today is 140.

## 2018-01-27 ENCOUNTER — Ambulatory Visit (INDEPENDENT_AMBULATORY_CARE_PROVIDER_SITE_OTHER): Payer: BC Managed Care – PPO

## 2018-01-27 DIAGNOSIS — Z8751 Personal history of pre-term labor: Secondary | ICD-10-CM

## 2018-01-27 DIAGNOSIS — O09212 Supervision of pregnancy with history of pre-term labor, second trimester: Secondary | ICD-10-CM | POA: Diagnosis not present

## 2018-01-27 NOTE — Progress Notes (Signed)
Nurse visit for pt supplied 17p given L upper outer quad w/o difficulty. Makena medication given to pt to take home since she will be out of town next week. She will have  friend who is a nurse to give the injection.

## 2018-01-30 NOTE — Progress Notes (Signed)
I have reviewed the chart and agree with nursing staff's documentation of this patient's encounter.  Aletha Halim, MD 01/30/2018 4:35 PM

## 2018-02-03 ENCOUNTER — Ambulatory Visit: Payer: BC Managed Care – PPO

## 2018-02-10 ENCOUNTER — Ambulatory Visit (INDEPENDENT_AMBULATORY_CARE_PROVIDER_SITE_OTHER): Payer: BC Managed Care – PPO | Admitting: Obstetrics and Gynecology

## 2018-02-10 VITALS — BP 104/79 | HR 72 | Wt 209.0 lb

## 2018-02-10 DIAGNOSIS — O26899 Other specified pregnancy related conditions, unspecified trimester: Secondary | ICD-10-CM

## 2018-02-10 DIAGNOSIS — Z98891 History of uterine scar from previous surgery: Secondary | ICD-10-CM

## 2018-02-10 DIAGNOSIS — O360930 Maternal care for other rhesus isoimmunization, third trimester, not applicable or unspecified: Secondary | ICD-10-CM | POA: Diagnosis not present

## 2018-02-10 DIAGNOSIS — O0993 Supervision of high risk pregnancy, unspecified, third trimester: Secondary | ICD-10-CM

## 2018-02-10 DIAGNOSIS — O34211 Maternal care for low transverse scar from previous cesarean delivery: Secondary | ICD-10-CM

## 2018-02-10 DIAGNOSIS — Z23 Encounter for immunization: Secondary | ICD-10-CM | POA: Diagnosis not present

## 2018-02-10 DIAGNOSIS — O99210 Obesity complicating pregnancy, unspecified trimester: Secondary | ICD-10-CM

## 2018-02-10 DIAGNOSIS — Z6791 Unspecified blood type, Rh negative: Secondary | ICD-10-CM

## 2018-02-10 DIAGNOSIS — O0990 Supervision of high risk pregnancy, unspecified, unspecified trimester: Secondary | ICD-10-CM

## 2018-02-10 DIAGNOSIS — E669 Obesity, unspecified: Secondary | ICD-10-CM

## 2018-02-10 DIAGNOSIS — Z3482 Encounter for supervision of other normal pregnancy, second trimester: Secondary | ICD-10-CM

## 2018-02-10 DIAGNOSIS — O99213 Obesity complicating pregnancy, third trimester: Secondary | ICD-10-CM

## 2018-02-10 DIAGNOSIS — O09213 Supervision of pregnancy with history of pre-term labor, third trimester: Secondary | ICD-10-CM | POA: Diagnosis not present

## 2018-02-10 DIAGNOSIS — O09523 Supervision of elderly multigravida, third trimester: Secondary | ICD-10-CM

## 2018-02-10 DIAGNOSIS — Z8751 Personal history of pre-term labor: Secondary | ICD-10-CM

## 2018-02-10 MED ORDER — RHO D IMMUNE GLOBULIN 1500 UNIT/2ML IJ SOSY
300.0000 ug | PREFILLED_SYRINGE | Freq: Once | INTRAMUSCULAR | Status: AC
  Administered 2018-02-10: 300 ug via INTRAMUSCULAR

## 2018-02-10 NOTE — Addendum Note (Signed)
Addended by: Phillip Heal, DEMETRICE A on: 02/10/2018 09:04 AM   Modules accepted: Orders

## 2018-02-10 NOTE — Progress Notes (Signed)
  17-P TODAY RHOGRAM TODAY TDAP TODAY

## 2018-02-10 NOTE — Progress Notes (Signed)
Prenatal Visit Note Date: 02/10/2018 Clinic: [facility]  Subjective:  Alexandra Douglas is a 37 y.o. [phone] at [redacted]w[redacted]d being seen today for ongoing prenatal care.  She is currently monitored for the following issues for this high-risk pregnancy and has History of preterm delivery; Supervision of high risk pregnancy, antepartum; Rh negative state in antepartum period; Precancerous skin lesion; Obesity (BMI 35.0-39.9 without comorbidity); AMA (advanced maternal age) multigravida 58+; History of VBAC; and Obesity in pregnancy on their problem list.  Patient reports no complaints.   Contractions: Not present. Vag. Bleeding: None.  Movement: Present. Denies leaking of fluid.   The following portions of the patient's history were reviewed and updated as appropriate: allergies, current medications, past family history, past medical history, past social history, past surgical history and problem list. Problem list updated.  Objective:   Vitals:   02/10/18 0828  BP: 104/79  Pulse: 72  Weight: 209 lb (94.8 kg)    Fetal Status: Fetal Heart Rate (bpm): 135 Fundal Height: 29 cm Movement: Present     General:  Alert, oriented and cooperative. Patient is in no acute distress.  Skin: Skin is warm and dry. No rash noted.   Cardiovascular: Normal heart rate noted  Respiratory: Normal respiratory effort, no problems with respiration noted  Abdomen: Soft, gravid, appropriate for gestational age. Pain/Pressure: Absent     Pelvic:  Cervical exam deferred        Extremities: Normal range of motion.  Edema: None  Mental Status: Normal mood and affect. Normal behavior. Normal judgment and thought content.   Urinalysis:      Assessment and Plan:  Pregnancy: G3P1103 at [redacted]w[redacted]d  1. Encounter for supervision of other normal pregnancy in second trimester Routine care. vasectomy - Glucose Tolerance, 2 Hours w/1 Hour - RPR - CBC - HIV antibody - Antibody screen; Future - Antibody  screen  2. Multigravida of advanced maternal age in third trimester  3. History of VBAC Sign tolac consent nv  4. History of preterm delivery 17p today  5. Obesity in pregnancy  6. Supervision of high risk pregnancy, antepartum  7. Rh negative state in antepartum period Antibody screen and rhogam today  8. Obesity (BMI 35.0-39.9 without comorbidity)  Preterm labor symptoms and general obstetric precautions including but not limited to vaginal bleeding, contractions, leaking of fluid and fetal movement were reviewed in detail with the patient. Please refer to After Visit Summary for other counseling recommendations.  Return in about 1 week (around 02/17/2018) for 1wk 17p and FHR check. 2wk rob, 17p.   Aletha Halim, MD

## 2018-02-11 ENCOUNTER — Encounter: Payer: Self-pay | Admitting: Obstetrics and Gynecology

## 2018-02-11 DIAGNOSIS — O24410 Gestational diabetes mellitus in pregnancy, diet controlled: Secondary | ICD-10-CM

## 2018-02-11 HISTORY — DX: Gestational diabetes mellitus in pregnancy, diet controlled: O24.410

## 2018-02-11 LAB — CBC
HEMOGLOBIN: 12.7 g/dL (ref 11.1–15.9)
Hematocrit: 38.8 % (ref 34.0–46.6)
MCH: 30.5 pg (ref 26.6–33.0)
MCHC: 32.7 g/dL (ref 31.5–35.7)
MCV: 93 fL (ref 79–97)
PLATELETS: 264 10*3/uL (ref 150–450)
RBC: 4.17 x10E6/uL (ref 3.77–5.28)
RDW: 12.2 % — ABNORMAL LOW (ref 12.3–15.4)
WBC: 7.4 10*3/uL (ref 3.4–10.8)

## 2018-02-11 LAB — RPR: RPR: NONREACTIVE

## 2018-02-11 LAB — HIV ANTIBODY (ROUTINE TESTING W REFLEX): HIV SCREEN 4TH GENERATION: NONREACTIVE

## 2018-02-11 LAB — GLUCOSE TOLERANCE, 2 HOURS W/ 1HR
GLUCOSE, FASTING: 80 mg/dL (ref 65–91)
Glucose, 1 hour: 183 mg/dL — ABNORMAL HIGH (ref 65–179)
Glucose, 2 hour: 95 mg/dL (ref 65–152)

## 2018-02-11 LAB — ANTIBODY SCREEN: Antibody Screen: NEGATIVE

## 2018-02-13 ENCOUNTER — Telehealth: Payer: Self-pay | Admitting: Obstetrics and Gynecology

## 2018-02-13 ENCOUNTER — Encounter: Payer: Self-pay | Admitting: Obstetrics and Gynecology

## 2018-02-13 DIAGNOSIS — O24419 Gestational diabetes mellitus in pregnancy, unspecified control: Secondary | ICD-10-CM

## 2018-02-13 MED ORDER — GLUCOSE BLOOD VI STRP: ORAL_STRIP | 12 refills | Status: DC

## 2018-02-13 MED ORDER — ACCU-CHEK AVIVA PLUS W/DEVICE KIT: PACK | 0 refills | Status: DC

## 2018-02-13 MED ORDER — ACCU-CHEK FASTCLIX LANCETS MISC: 12 refills | Status: DC

## 2018-02-13 NOTE — Telephone Encounter (Signed)
Pt has MyChart message of labs and instructions to call the office. 2 HR Glucose showed GDM. Please call pt to go over the result and next steps.

## 2018-02-13 NOTE — Telephone Encounter (Signed)
Called patient - advised of lab results; provider recommendations; diabetes kit electronically sent to pharmacy (patient confirmed); ref to [facility] nutrition/diabets classes - clld [place] office - [place] to confirm they recd information through referral portal re patient; added patient to baby scripts - advised of process to complete sign up process.   Patient stated she understood and had no other questions at this time.

## 2018-02-15 ENCOUNTER — Encounter: Payer: Self-pay | Admitting: *Deleted

## 2018-02-15 ENCOUNTER — Encounter: Payer: BC Managed Care – PPO | Attending: Obstetrics and Gynecology | Admitting: *Deleted

## 2018-02-15 VITALS — BP 104/70 | Ht 63.0 in | Wt 218.9 lb

## 2018-02-15 DIAGNOSIS — Z713 Dietary counseling and surveillance: Secondary | ICD-10-CM | POA: Diagnosis not present

## 2018-02-15 DIAGNOSIS — Z3A00 Weeks of gestation of pregnancy not specified: Secondary | ICD-10-CM | POA: Diagnosis not present

## 2018-02-15 DIAGNOSIS — O24410 Gestational diabetes mellitus in pregnancy, diet controlled: Secondary | ICD-10-CM

## 2018-02-15 DIAGNOSIS — O24419 Gestational diabetes mellitus in pregnancy, unspecified control: Secondary | ICD-10-CM | POA: Diagnosis present

## 2018-02-15 NOTE — Progress Notes (Signed)
Diabetes Self-Management Education  Visit Type: First/Initial  Appt. Start Time: 0830 Appt. End Time: 1020  02/15/2018  [name], identified by name and date of birth, is a 37 y.o. female with a diagnosis of Diabetes: Gestational Diabetes.   ASSESSMENT  Blood pressure 104/70, height 5\' 3"  (1.6 m), weight 218 lb 14.4 oz (99.3 kg), last menstrual period 07/22/2017. Body mass index is 38.78 kg/m.  Diabetes Self-Management Education - 02/15/18 1041        Visit Information    Visit Type  First/Initial        Initial Visit    Diabetes Type  Gestational Diabetes     Are you currently following a meal plan?  Yes     What type of meal plan do you follow?  "reduced carbs, cut out sugary treats and fast food"     Are you taking your medications as prescribed?  Yes     Date Diagnosed  1 week        Health Coping    How would you rate your overall health?  Good        Psychosocial Assessment    Patient Belief/Attitude about Diabetes  Other (comment) "frustrated"     Self-care barriers  None     Self-management support  Doctor's office;Family     Patient Concerns  Nutrition/Meal planning;Weight Control;Glycemic Control;Healthy Lifestyle     Special Needs  None     Preferred Learning Style  Auditory;Visual;Hands on     [place] in progress     How often do you need to have someone help you when you read instructions, pamphlets, or other written materials from your doctor or pharmacy?  1 - Never     What is the last grade level you completed in school?  Bachelor's degree        Pre-Education Assessment    Patient understands the diabetes disease and treatment process.  Needs Instruction     Patient understands incorporating nutritional management into lifestyle.  Needs Instruction     Patient undertands incorporating physical activity into lifestyle.  Needs Review     Patient understands using medications safely.  Needs Instruction     Patient understands  monitoring blood glucose, interpreting and using results  Needs Instruction     Patient understands prevention, detection, and treatment of acute complications.  Needs Instruction     Patient understands prevention, detection, and treatment of chronic complications.  Needs Instruction     Patient understands how to develop strategies to address psychosocial issues.  Needs Instruction     Patient understands how to develop strategies to promote health/change behavior.  Needs Instruction        Complications    Last HgB A1C per patient/outside source 5.4 % 09/22/17    How often do you check your blood sugar?  0 times/day (not testing) Provided Accu-Chek FastClix and instructed on use. BG upon return demonstration was 102 mg/dL at 10:10 am - 1 hr after eating an apple.      Have you had a dilated eye exam in the past 12 months?  No     Have you had a dental exam in the past 12 months?  Yes     Are you checking your feet?  No        Dietary Intake    Breakfast  oatmeal and apples; peanut butter toast; cereal and milk; fruit     Snack (morning)  cottage cheese,  fruit, almonds, cheese and crackers     Lunch  cottage cheese; [place] sandwich; peanut butter and celery; tuna; fruit     Snack (afternoon)  same as morning snack     Snack (evening)  chicken, beef, pork, salmon with potatoes, bread, corn, broccoli, asparagus, green beans, salads     Beverage(s)  water, milk, occasional diet soda        Exercise    Exercise Type  Light (walking / raking leaves)     How many days per week to you exercise?  5     How many minutes per day do you exercise?  40     Total minutes per week of exercise  200        Patient Education    Previous Diabetes Education  No     Disease state   Definition of diabetes, type 1 and 2, and the diagnosis of diabetes;Factors that contribute to the development of diabetes     Nutrition management   Role of diet in the treatment of diabetes and the relationship between the  three main macronutrients and blood glucose level;Reviewed blood glucose goals for pre and post meals and how to evaluate the patients' food intake on their blood glucose level.     Physical activity and exercise   Role of exercise on diabetes management, blood pressure control and cardiac health.     Monitoring  Taught/evaluated SMBG meter.;Purpose and frequency of SMBG.;Taught/discussed recording of test results and interpretation of SMBG.;Ketone testing, when, how.     Chronic complications  Relationship between chronic complications and blood glucose control     Psychosocial adjustment  Identified and addressed patients feelings and concerns about diabetes     Preconception care  Pregnancy and GDM  Role of pre-pregnancy blood glucose control on the development of the fetus;Role of family planning for patients with diabetes;Reviewed with patient blood glucose goals with pregnancy        Individualized Goals (developed by patient)    Reducing Risk  Improve blood sugars Lose weight Lead a healthier lifestyle Become more fit       Outcomes    Expected Outcomes  Demonstrated interest in learning. Expect positive outcomes       Individualized Plan for Diabetes Self-Management Training:   Learning Objective:  Patient will have a greater understanding of diabetes self-management. Patient education plan is to attend individual and/or group sessions per assessed needs and concerns.   Plan:   Patient Instructions  Read booklet on Gestational Diabetes Follow Gestational Meal Planning Guidelines Avoid cold cereal for breakfast Complete a 3 Day Food Record and bring to next appointment Check blood sugars 4 x day - before breakfast and 2 hrs after every meal and record  Bring blood sugar log to all appointments Call MD for prescription for meter strips and lancets Strips Accu-Chek Guide Lancets   Accu-Chek FastClix Purchase urine ketone strips if blood sugars not controlled and check urine  ketones every am:  If + increase bedtime snack to 1 protein and 2 carbohydrate servings Walk 20-30 minutes at least 5 x week if permitted by MD  Expected Outcomes:  Demonstrated interest in learning. Expect positive outcomes  Education material provided: Gestational Booklet Gestational Meal Planning Guidelines Simple Meal Plan Viewed Gestational Diabetes Video Meter = Accu-Chek Guide 3 Day Food Record Goals for a Healthy Pregnancy  If problems or questions, patient to contact team via:  Johny Drilling, RN, [place], CDE [phone]  Future DSME  appointment:  February 23, 2018 with the dietitian

## 2018-02-15 NOTE — Patient Instructions (Signed)
Read booklet on Gestational Diabetes Follow Gestational Meal Planning Guidelines Avoid cold cereal for breakfast Complete a 3 Day Food Record and bring to next appointment Check blood sugars 4 x day - before breakfast and 2 hrs after every meal and record  Bring blood sugar log to all appointments Call MD for prescription for meter strips and lancets Strips Accu-Chek Guide Lancets   Accu-Chek FastClix Purchase urine ketone strips if blood sugars not controlled and check urine ketones every am:  If + increase bedtime snack to 1 protein and 2 carbohydrate servings Walk 20-30 minutes at least 5 x week if permitted by MD

## 2018-02-17 ENCOUNTER — Encounter: Payer: Self-pay | Admitting: Obstetrics and Gynecology

## 2018-02-17 ENCOUNTER — Ambulatory Visit (INDEPENDENT_AMBULATORY_CARE_PROVIDER_SITE_OTHER): Payer: BC Managed Care – PPO | Admitting: Obstetrics & Gynecology

## 2018-02-17 ENCOUNTER — Ambulatory Visit: Payer: BC Managed Care – PPO

## 2018-02-17 VITALS — BP 114/75 | HR 69 | Wt 217.8 lb

## 2018-02-17 DIAGNOSIS — Z8751 Personal history of pre-term labor: Secondary | ICD-10-CM

## 2018-02-17 DIAGNOSIS — O09213 Supervision of pregnancy with history of pre-term labor, third trimester: Secondary | ICD-10-CM

## 2018-02-17 MED ORDER — HYDROXYPROGESTERONE CAPROATE 275 MG/1.1ML ~~LOC~~ SOAJ: 275.0000 mg | Freq: Once | SUBCUTANEOUS | Status: DC

## 2018-02-17 MED ORDER — HYDROXYPROGESTERONE CAPROATE 250 MG/ML IM OIL
250.0000 mg | TOPICAL_OIL | Freq: Once | INTRAMUSCULAR | Status: AC
  Administered 2018-02-17: 250 mg via INTRAMUSCULAR

## 2018-02-17 NOTE — Progress Notes (Signed)
FHR 142 Hydroxyprogesterone given today, pt tolerated well

## 2018-02-23 ENCOUNTER — Encounter: Payer: Self-pay | Admitting: Dietician

## 2018-02-23 ENCOUNTER — Encounter: Payer: BC Managed Care – PPO | Attending: Obstetrics and Gynecology | Admitting: Dietician

## 2018-02-23 VITALS — BP 104/70 | Ht 63.0 in | Wt 218.1 lb

## 2018-02-23 DIAGNOSIS — O24419 Gestational diabetes mellitus in pregnancy, unspecified control: Secondary | ICD-10-CM | POA: Insufficient documentation

## 2018-02-23 DIAGNOSIS — Z713 Dietary counseling and surveillance: Secondary | ICD-10-CM | POA: Insufficient documentation

## 2018-02-23 DIAGNOSIS — Z3A00 Weeks of gestation of pregnancy not specified: Secondary | ICD-10-CM | POA: Diagnosis not present

## 2018-02-23 DIAGNOSIS — O24410 Gestational diabetes mellitus in pregnancy, diet controlled: Secondary | ICD-10-CM

## 2018-02-23 NOTE — Progress Notes (Signed)
   Patient's BG record indicates BGs are all within goal ranges.  Patient's food diary indicates well balanced meals and healthy food choices. Patient seems to have a good understanding of meal planning.  Provided 1700kcal meal plan, and wrote individualized menus based on patient's food preferences.  Instructed patient on food safety, including avoidance of Listeriosis, and limiting mercury from fish.  Discussed importance of maintaining healthy lifestyle habits to reduce risk of Type 2 DM as well as Gestational DM with any future pregnancies.  Advised patient to use any remaining testing supplies to test some BGs after delivery, and to have BG tested ideally annually, as well as prior to attempting future pregnancies.

## 2018-02-24 ENCOUNTER — Ambulatory Visit (INDEPENDENT_AMBULATORY_CARE_PROVIDER_SITE_OTHER): Payer: BC Managed Care – PPO | Admitting: Obstetrics and Gynecology

## 2018-02-24 VITALS — BP 102/69 | HR 73 | Wt 217.2 lb

## 2018-02-24 DIAGNOSIS — Z98891 History of uterine scar from previous surgery: Secondary | ICD-10-CM

## 2018-02-24 DIAGNOSIS — O09213 Supervision of pregnancy with history of pre-term labor, third trimester: Secondary | ICD-10-CM | POA: Diagnosis not present

## 2018-02-24 DIAGNOSIS — O26899 Other specified pregnancy related conditions, unspecified trimester: Secondary | ICD-10-CM

## 2018-02-24 DIAGNOSIS — O0990 Supervision of high risk pregnancy, unspecified, unspecified trimester: Secondary | ICD-10-CM

## 2018-02-24 DIAGNOSIS — E669 Obesity, unspecified: Secondary | ICD-10-CM

## 2018-02-24 DIAGNOSIS — O09523 Supervision of elderly multigravida, third trimester: Secondary | ICD-10-CM

## 2018-02-24 DIAGNOSIS — O99210 Obesity complicating pregnancy, unspecified trimester: Secondary | ICD-10-CM

## 2018-02-24 DIAGNOSIS — Z6791 Unspecified blood type, Rh negative: Secondary | ICD-10-CM

## 2018-02-24 DIAGNOSIS — Z8751 Personal history of pre-term labor: Secondary | ICD-10-CM

## 2018-02-24 DIAGNOSIS — O24410 Gestational diabetes mellitus in pregnancy, diet controlled: Secondary | ICD-10-CM

## 2018-02-24 NOTE — Telephone Encounter (Signed)
Patient was seen in the office today

## 2018-02-24 NOTE — Telephone Encounter (Signed)
Patient will discuss weight difference at [facility] visit.

## 2018-02-24 NOTE — Progress Notes (Signed)
Prenatal Visit Note Date: 02/24/2018 Clinic: [facility]  Subjective:  Alexandra Douglas is a 37 y.o. [phone] at [redacted]w[redacted]d being seen today for ongoing prenatal care.  She is currently monitored for the following issues for this high-risk pregnancy and has History of preterm delivery; Supervision of high risk pregnancy, antepartum; Rh negative state in antepartum period; Precancerous skin lesion; Obesity (BMI 35.0-39.9 without comorbidity); AMA (advanced maternal age) multigravida 35+; History of VBAC; Obesity in pregnancy; and GDM (gestational diabetes mellitus) on their problem list.  Patient reports no complaints.   Contractions: Not present. Vag. Bleeding: None.  Movement: Present. Denies leaking of fluid.   The following portions of the patient's history were reviewed and updated as appropriate: allergies, current medications, past family history, past medical history, past social history, past surgical history and problem list. Problem list updated.  Objective:   Vitals:   02/24/18 0837  BP: 102/69  Pulse: 73  Weight: 217 lb 3.2 oz (98.5 kg)    Fetal Status: Fetal Heart Rate (bpm): 138 Fundal Height: 32 cm Movement: Present     General:  Alert, oriented and cooperative. Patient is in no acute distress.  Skin: Skin is warm and dry. No rash noted.   Cardiovascular: Normal heart rate noted  Respiratory: Normal respiratory effort, no problems with respiration noted  Abdomen: Soft, gravid, appropriate for gestational age. Pain/Pressure: Absent     Pelvic:  Cervical exam deferred        Extremities: Normal range of motion.  Edema: None  Mental Status: Normal mood and affect. Normal behavior. Normal judgment and thought content.   Urinalysis:      Assessment and Plan:  Pregnancy: G3P1103 at [redacted]w[redacted]d  1. Multigravida of advanced maternal age in third trimester No issues  2. History of VBAC Desires rpt vbac. Sign consent nv  3. Obesity in pregnancy Doing well.  Weight in computer incorrect.  6/21: 213 lbs 7/19: 219 lbs  4. History of preterm delivery 17p today. Continue qwk  5. Supervision of high risk pregnancy, antepartum vasectomy  6. Rh negative state in antepartum period Rpt pp prn  7. Obesity (BMI 35.0-39.9 without comorbidity)  8. Diet controlled gestational diabetes mellitus (GDM) in third trimester Doing well. Surveillance growth at 37-39wks  Preterm labor symptoms and general obstetric precautions including but not limited to vaginal bleeding, contractions, leaking of fluid and fetal movement were reviewed in detail with the patient. Please refer to After Visit Summary for other counseling recommendations.  Return in about 1 week (around 03/03/2018) for 17p and rob.   Aletha Halim, MD

## 2018-03-03 ENCOUNTER — Ambulatory Visit (INDEPENDENT_AMBULATORY_CARE_PROVIDER_SITE_OTHER): Payer: BC Managed Care – PPO | Admitting: Obstetrics & Gynecology

## 2018-03-03 VITALS — BP 102/70 | HR 72 | Wt 217.6 lb

## 2018-03-03 DIAGNOSIS — O24410 Gestational diabetes mellitus in pregnancy, diet controlled: Secondary | ICD-10-CM

## 2018-03-03 DIAGNOSIS — Z8751 Personal history of pre-term labor: Secondary | ICD-10-CM

## 2018-03-03 DIAGNOSIS — O0990 Supervision of high risk pregnancy, unspecified, unspecified trimester: Secondary | ICD-10-CM

## 2018-03-03 DIAGNOSIS — Z98891 History of uterine scar from previous surgery: Secondary | ICD-10-CM

## 2018-03-03 DIAGNOSIS — O0993 Supervision of high risk pregnancy, unspecified, third trimester: Secondary | ICD-10-CM

## 2018-03-03 DIAGNOSIS — O34219 Maternal care for unspecified type scar from previous cesarean delivery: Secondary | ICD-10-CM

## 2018-03-03 DIAGNOSIS — O09213 Supervision of pregnancy with history of pre-term labor, third trimester: Secondary | ICD-10-CM

## 2018-03-03 NOTE — Progress Notes (Signed)
   PRENATAL VISIT NOTE  Subjective:  Alexandra Douglas is a 37 y.o. G3P1103 at [redacted]w[redacted]d being seen today for ongoing prenatal care.  She is currently monitored for the following issues for this high-risk pregnancy and has History of preterm delivery; Supervision of high risk pregnancy, antepartum; Rh negative state in antepartum period; Precancerous skin lesion; Obesity (BMI 35.0-39.9 without comorbidity); AMA (advanced maternal age) multigravida 6+; History of VBAC; Obesity in pregnancy; and GDM (gestational diabetes mellitus) on their problem list.  Patient reports no complaints.  Contractions: Not present.  .  Movement: Present. Denies leaking of fluid.   The following portions of the patient's history were reviewed and updated as appropriate: allergies, current medications, past family history, past medical history, past social history, past surgical history and problem list. Problem list updated.  Objective:   Vitals:   03/03/18 1136  BP: 102/70  Pulse: 72  Weight: 217 lb 9.6 oz (98.7 kg)    Fetal Status: Fetal Heart Rate (bpm): 146 Fundal Height: 32 cm Movement: Present     General:  Alert, oriented and cooperative. Patient is in no acute distress.  Skin: Skin is warm and dry. No rash noted.   Cardiovascular: Normal heart rate noted  Respiratory: Normal respiratory effort, no problems with respiration noted  Abdomen: Soft, gravid, appropriate for gestational age.  Pain/Pressure: Absent     Pelvic: Cervical exam deferred        Extremities: Normal range of motion.  Edema: None  Mental Status: Normal mood and affect. Normal behavior. Normal judgment and thought content.   Assessment and Plan:  Pregnancy: G3P1103 at [redacted]w[redacted]d  1. Diet controlled gestational diabetes mellitus (GDM) in third trimester Stable CBGs, needs growth scan around 38 weeks. Schedule next visit.  2. History of preterm delivery Continue weekly 17P  3. History of VBAC Counseled regarding TOLAC vs RCS;  risks/benefits discussed in detail. All questions answered.  Patient elects for TOLAC, had a VBAC in past, consent signed 03/03/2018.  4. Supervision of high risk pregnancy, antepartum Preterm labor symptoms and general obstetric precautions including but not limited to vaginal bleeding, contractions, leaking of fluid and fetal movement were reviewed in detail with the patient. Please refer to After Visit Summary for other counseling recommendations.  Return in about 1 week (around 03/10/2018) for 17P only 2 weeks: 17P and OB visit.   Verita Schneiders, MD

## 2018-03-03 NOTE — Patient Instructions (Signed)
Return to clinic for any scheduled appointments or obstetric concerns, or go to MAU for evaluation  Vaginal Birth After Cesarean Delivery Vaginal birth after cesarean delivery (VBAC) is giving birth vaginally after previously delivering a baby by a cesarean. In the past, if a woman had a cesarean delivery, all births afterward would be done by cesarean delivery. This is no longer true. It can be safe for the mother to try a vaginal delivery after having a cesarean delivery. It is important to discuss VBAC with your health care provider early in the pregnancy so you can understand the risks, benefits, and options. It will give you time to decide what is best in your particular case. The final decision about whether to have a VBAC or repeat cesarean delivery should be between you and your health care provider. Any changes in your health or your baby's health during your pregnancy may make it necessary to change your initial decision about VBAC. Women who plan to have a VBAC should check with their health care provider to be sure that:  The previous cesarean delivery was done with a low transverse uterine cut (incision) (not a vertical classical incision).  The birth canal is big enough for the baby.  There were no other operations on the uterus.  An electronic fetal monitor (EFM) will be on at all times during labor.  An operating room will be available and ready in case an emergency cesarean delivery is needed.  A health care provider and surgical nursing staff will be available at all times during labor to be ready to do an emergency delivery cesarean if necessary.  An anesthesiologist will be present in case an emergency cesarean delivery is needed.  The nursery is prepared and has adequate personnel and necessary equipment available to care for the baby in case of an emergency cesarean delivery. Benefits of VBAC  Shorter stay in the hospital.  Avoidance of risks associated with cesarean  delivery, such as: ? Surgical complications, such as opening of the incision or hernia in the incision. ? Injury to other organs. ? Fever. This can occur if an infection develops after surgery. It can also occur as a reaction to the medicine given to make you numb during the surgery.  Less blood loss and need for blood transfusions.  Lower risk of blood clots and infection.  Shorter recovery.  Decreased risk for having to remove the uterus (hysterectomy).  Decreased risk for the placenta to completely or partially cover the opening of the uterus (placenta previa) with a future pregnancy.  Decrease risk in future labor and delivery. Risks of a VBAC  Tearing (rupture) of the uterus. This is occurs in less than 1% of VBACs. The risk of this happening is higher if: ? Steps are taken to begin the labor process (induce labor) or stimulate or strengthen contractions (augment labor). ? Medicine is used to soften (ripen) the cervix.  Having to remove the uterus (hysterectomy) if it ruptures. VBAC should not be done if:  The previous cesarean delivery was done with a vertical (classical) or T-shaped incision or you do not know what kind of incision was made.  You had a ruptured uterus.  You have had certain types of surgery on your uterus, such as removal of uterine fibroids. Ask your health care provider about other types of surgeries that prevent you from having a VBAC.  You have certain medical or childbirth (obstetrical) problems.  There are problems with the baby.  You have  had two previous cesarean deliveries and no vaginal deliveries. Other facts to know about VBAC:  It is safe to have an epidural anesthetic with VBAC.  It is safe to turn the baby from a breech position (attempt an external cephalic version).  It is safe to try a VBAC with twins.  VBAC may not be successful if your baby weights 8.8 lb (4 kg) or more. However, weight predictions are not always accurate and  should not be used alone to decide if VBAC is right for you.  There is an increased failure rate if the time between the cesarean delivery and VBAC is less than 19 months.  Your health care provider may advise against a VBAC if you have preeclampsia (high blood pressure, protein in the urine, and swelling of face and extremities).  VBAC is often successful if you previously gave birth vaginally.  VBAC is often successful when the labor starts spontaneously before the due date.  Delivering a baby through a VBAC is similar to having a normal spontaneous vaginal delivery. This information is not intended to replace advice given to you by your health care provider. Make sure you discuss any questions you have with your health care provider. Document Released: 01/02/2007 Document Revised: 12/18/2015 Document Reviewed: 02/08/2013 Elsevier Interactive Patient Education  [employer].

## 2018-03-07 ENCOUNTER — Other Ambulatory Visit: Payer: Self-pay

## 2018-03-07 DIAGNOSIS — O24419 Gestational diabetes mellitus in pregnancy, unspecified control: Secondary | ICD-10-CM

## 2018-03-07 MED ORDER — GLUCOSE BLOOD VI STRP
ORAL_STRIP | 12 refills | Status: DC
Start: 2018-03-07 — End: 2018-04-09

## 2018-03-10 ENCOUNTER — Ambulatory Visit (INDEPENDENT_AMBULATORY_CARE_PROVIDER_SITE_OTHER): Payer: BC Managed Care – PPO

## 2018-03-10 DIAGNOSIS — O0990 Supervision of high risk pregnancy, unspecified, unspecified trimester: Secondary | ICD-10-CM

## 2018-03-10 DIAGNOSIS — O09213 Supervision of pregnancy with history of pre-term labor, third trimester: Secondary | ICD-10-CM

## 2018-03-10 NOTE — Progress Notes (Signed)
Patient presented to the office today for 17-p injection received in left gluteal IM. #ndc [id number]. Patient tolerated well and will follow up in one week for next injection. FHR today was 134

## 2018-03-13 NOTE — Progress Notes (Signed)
I have reviewed this chart and agree with the RN/CMA assessment and management.    Ryu Cerreta C Hallel Denherder, MD, FACOG Attending Physician, [facility]  

## 2018-03-14 NOTE — Progress Notes (Signed)
Pt request to wait on Influenza vaccine - Last 02/19

## 2018-03-15 ENCOUNTER — Ambulatory Visit (INDEPENDENT_AMBULATORY_CARE_PROVIDER_SITE_OTHER): Payer: BC Managed Care – PPO | Admitting: Family Medicine

## 2018-03-15 VITALS — BP 104/71 | HR 76 | Wt 217.2 lb

## 2018-03-15 DIAGNOSIS — Z8751 Personal history of pre-term labor: Secondary | ICD-10-CM

## 2018-03-15 DIAGNOSIS — O24410 Gestational diabetes mellitus in pregnancy, diet controlled: Secondary | ICD-10-CM

## 2018-03-15 DIAGNOSIS — O0993 Supervision of high risk pregnancy, unspecified, third trimester: Secondary | ICD-10-CM

## 2018-03-15 DIAGNOSIS — O0990 Supervision of high risk pregnancy, unspecified, unspecified trimester: Secondary | ICD-10-CM

## 2018-03-15 DIAGNOSIS — O09523 Supervision of elderly multigravida, third trimester: Secondary | ICD-10-CM

## 2018-03-15 NOTE — Patient Instructions (Signed)

## 2018-03-15 NOTE — Progress Notes (Signed)
   PRENATAL VISIT NOTE  Subjective:  Alexandra Douglas is a 37 y.o. G3P1103 at [redacted]w[redacted]d being seen today for ongoing prenatal care.  She is currently monitored for the following issues for this high-risk pregnancy and has History of preterm delivery; Supervision of high risk pregnancy, antepartum; Rh negative state in antepartum period; Precancerous skin lesion; Obesity (BMI 35.0-39.9 without comorbidity); AMA (advanced maternal age) multigravida 20+; History of VBAC; Obesity in pregnancy; and GDM (gestational diabetes mellitus) on their problem list.  Patient reports no complaints.  Contractions: Not present. Vag. Bleeding: None.  Movement: Present. Denies leaking of fluid.   The following portions of the patient's history were reviewed and updated as appropriate: allergies, current medications, past family history, past medical history, past social history, past surgical history and problem list. Problem list updated.  Objective:   Vitals:   03/15/18 1128  BP: 104/71  Pulse: 76  Weight: 217 lb 3.2 oz (98.5 kg)    Fetal Status: Fetal Heart Rate (bpm): 149 Fundal Height: 32 cm Movement: Present     General:  Alert, oriented and cooperative. Patient is in no acute distress.  Skin: Skin is warm and dry. No rash noted.   Cardiovascular: Normal heart rate noted  Respiratory: Normal respiratory effort, no problems with respiration noted  Abdomen: Soft, gravid, appropriate for gestational age.  Pain/Pressure: Absent     Pelvic: Cervical exam deferred        Extremities: Normal range of motion.  Edema: None  Mental Status: Normal mood and affect. Normal behavior. Normal judgment and thought content.   Assessment and Plan:  Pregnancy: G3P1103 at [redacted]w[redacted]d  1. Supervision of high risk pregnancy, antepartum Continue prenatal care.   2. Multigravida of advanced maternal age in third trimester Low risk NIPT  3. History of preterm delivery Continue 17 P  4. Diet controlled gestational diabetes  mellitus (GDM) in third trimester FBS 78-95 2 hour pp 79-106 CBGs are good, continue diet U/S for growth at 37-38 wks. - [place] MFM OB FOLLOW UP; Future  Preterm labor symptoms and general obstetric precautions including but not limited to vaginal bleeding, contractions, leaking of fluid and fetal movement were reviewed in detail with the patient. Please refer to After Visit Summary for other counseling recommendations.  Return in 2 weeks (on 03/29/2018).  Future Appointments  Date Time Provider [place]  03/17/2018  8:00 AM [facility] NURSE [facility]  03/24/2018  9:00 AM [facility] NURSE [facility]  03/31/2018  9:30 AM Aletha Halim, MD [facility]  04/06/2018 10:30 AM [facility] [place] 1 [facility]    Donnamae Jude, MD

## 2018-03-17 ENCOUNTER — Ambulatory Visit (INDEPENDENT_AMBULATORY_CARE_PROVIDER_SITE_OTHER): Payer: BC Managed Care – PPO

## 2018-03-17 VITALS — BP 105/72 | HR 71

## 2018-03-17 DIAGNOSIS — O09213 Supervision of pregnancy with history of pre-term labor, third trimester: Secondary | ICD-10-CM

## 2018-03-17 DIAGNOSIS — Z8751 Personal history of pre-term labor: Secondary | ICD-10-CM

## 2018-03-17 NOTE — Progress Notes (Signed)
Patient presented to the office today for her 17- p injection received in her left gluteal area. Patient tolerated well and will follow up next week for next injection. [place] #[id number]

## 2018-03-17 NOTE — Progress Notes (Signed)
Patient seen and assessed by nursing staff.  Agree with documentation and plan.  

## 2018-03-24 ENCOUNTER — Ambulatory Visit (INDEPENDENT_AMBULATORY_CARE_PROVIDER_SITE_OTHER): Payer: BC Managed Care – PPO

## 2018-03-24 VITALS — BP 107/72 | HR 72

## 2018-03-24 DIAGNOSIS — O09213 Supervision of pregnancy with history of pre-term labor, third trimester: Secondary | ICD-10-CM | POA: Diagnosis not present

## 2018-03-24 DIAGNOSIS — O0990 Supervision of high risk pregnancy, unspecified, unspecified trimester: Secondary | ICD-10-CM

## 2018-03-24 NOTE — Progress Notes (Signed)
Patient presented to the office today for her 17-P injection received in left ventogluteal. Patient tolerated well and will follow up in one week.  Marland Kitchen

## 2018-03-31 ENCOUNTER — Ambulatory Visit (INDEPENDENT_AMBULATORY_CARE_PROVIDER_SITE_OTHER): Payer: BC Managed Care – PPO | Admitting: Obstetrics and Gynecology

## 2018-03-31 VITALS — BP 121/84 | HR 79 | Wt 221.6 lb

## 2018-03-31 DIAGNOSIS — Z113 Encounter for screening for infections with a predominantly sexual mode of transmission: Secondary | ICD-10-CM | POA: Diagnosis not present

## 2018-03-31 DIAGNOSIS — Z98891 History of uterine scar from previous surgery: Secondary | ICD-10-CM

## 2018-03-31 DIAGNOSIS — O09213 Supervision of pregnancy with history of pre-term labor, third trimester: Secondary | ICD-10-CM

## 2018-03-31 DIAGNOSIS — O34219 Maternal care for unspecified type scar from previous cesarean delivery: Secondary | ICD-10-CM

## 2018-03-31 DIAGNOSIS — Z23 Encounter for immunization: Secondary | ICD-10-CM

## 2018-03-31 DIAGNOSIS — O0990 Supervision of high risk pregnancy, unspecified, unspecified trimester: Secondary | ICD-10-CM

## 2018-03-31 DIAGNOSIS — O0993 Supervision of high risk pregnancy, unspecified, third trimester: Secondary | ICD-10-CM

## 2018-03-31 DIAGNOSIS — O24410 Gestational diabetes mellitus in pregnancy, diet controlled: Secondary | ICD-10-CM

## 2018-03-31 NOTE — Progress Notes (Signed)
Prenatal Visit Note Date: 03/31/2018 Clinic: [facility]  Subjective:  Alexandra Douglas is a 37 y.o. [phone] at [redacted]w[redacted]d being seen today for ongoing prenatal care.  She is currently monitored for the following issues for this high-risk pregnancy and has History of preterm delivery; Supervision of high risk pregnancy, antepartum; Rh negative state in antepartum period; Precancerous skin lesion; Obesity (BMI 35.0-39.9 without comorbidity); AMA (advanced maternal age) multigravida 80+; History of VBAC; Obesity in pregnancy; and GDM (gestational diabetes mellitus), class A1 on their problem list.  Patient reports no complaints.   Contractions: Not present. Vag. Bleeding: None.  Movement: Present. Denies leaking of fluid.   The following portions of the patient's history were reviewed and updated as appropriate: allergies, current medications, past family history, past medical history, past social history, past surgical history and problem list. Problem list updated.  Objective:   Vitals:   03/31/18 0936  BP: 121/84  Pulse: 79  Weight: 221 lb 9.6 oz (100.5 kg)    Fetal Status: Fetal Heart Rate (bpm): 132 Fundal Height: 37 cm Movement: Present  Presentation: Vertex  General:  Alert, oriented and cooperative. Patient is in no acute distress.  Skin: Skin is warm and dry. No rash noted.   Cardiovascular: Normal heart rate noted  Respiratory: Normal respiratory effort, no problems with respiration noted  Abdomen: Soft, gravid, appropriate for gestational age. Pain/Pressure: Absent     Pelvic:  Cervical exam deferred        Extremities: Normal range of motion.  Edema: Trace  Mental Status: Normal mood and affect. Normal behavior. Normal judgment and thought content.   Urinalysis:      Assessment and Plan:  Pregnancy: G3P1103 at [redacted]w[redacted]d  1. Supervision of high risk pregnancy, antepartum Routine care. D/w pt re: BC nv. Normal FHR today - Culture, beta strep (group b only) -  Cervicovaginal ancillary only  2. Need for immunization against influenza - Flu Vaccine QUAD 36+ mos IM  3. GDM (gestational diabetes mellitus), class A1 Normal BS log. Surveillance growth nv  4. History of VBAC tolac consent already signed  Preterm labor symptoms and general obstetric precautions including but not limited to vaginal bleeding, contractions, leaking of fluid and fetal movement were reviewed in detail with the patient. Please refer to After Visit Summary for other counseling recommendations.  Return in about 1 week (around 04/07/2018) for rob.   Aletha Halim, MD

## 2018-03-31 NOTE — Patient Instructions (Signed)

## 2018-04-03 LAB — CERVICOVAGINAL ANCILLARY ONLY
CHLAMYDIA, DNA PROBE: NEGATIVE
Neisseria Gonorrhea: NEGATIVE

## 2018-04-04 LAB — CULTURE, BETA STREP (GROUP B ONLY): Strep Gp B Culture: NEGATIVE

## 2018-04-06 ENCOUNTER — Other Ambulatory Visit: Payer: Self-pay | Admitting: Family Medicine

## 2018-04-06 ENCOUNTER — Ambulatory Visit (HOSPITAL_BASED_OUTPATIENT_CLINIC_OR_DEPARTMENT_OTHER)
Admission: RE | Admit: 2018-04-06 | Discharge: 2018-04-06 | Disposition: A | Discharge status: Home / Self Care | Payer: BC Managed Care – PPO | Source: Ambulatory Visit | Attending: Family Medicine | Admitting: Family Medicine

## 2018-04-06 ENCOUNTER — Encounter (HOSPITAL_COMMUNITY): Payer: Self-pay

## 2018-04-06 DIAGNOSIS — O09523 Supervision of elderly multigravida, third trimester: Secondary | ICD-10-CM

## 2018-04-06 DIAGNOSIS — O34219 Maternal care for unspecified type scar from previous cesarean delivery: Secondary | ICD-10-CM

## 2018-04-06 DIAGNOSIS — O09213 Supervision of pregnancy with history of pre-term labor, third trimester: Secondary | ICD-10-CM

## 2018-04-06 DIAGNOSIS — O24410 Gestational diabetes mellitus in pregnancy, diet controlled: Secondary | ICD-10-CM

## 2018-04-06 DIAGNOSIS — Z3A36 36 weeks gestation of pregnancy: Secondary | ICD-10-CM

## 2018-04-07 ENCOUNTER — Inpatient Hospital Stay (HOSPITAL_COMMUNITY)
Admission: AD | Admit: 2018-04-07 | Discharge: 2018-04-09 | DRG: 807 | Disposition: A | Discharge status: Home / Self Care | Payer: BC Managed Care – PPO | Attending: Obstetrics and Gynecology | Admitting: Obstetrics and Gynecology

## 2018-04-07 ENCOUNTER — Encounter (HOSPITAL_COMMUNITY): Payer: Self-pay | Admitting: *Deleted

## 2018-04-07 ENCOUNTER — Inpatient Hospital Stay (HOSPITAL_COMMUNITY): Payer: BC Managed Care – PPO | Admitting: Anesthesiology

## 2018-04-07 ENCOUNTER — Encounter: Payer: BC Managed Care – PPO | Admitting: Obstetrics and Gynecology

## 2018-04-07 ENCOUNTER — Other Ambulatory Visit (HOSPITAL_COMMUNITY): Payer: Self-pay | Admitting: *Deleted

## 2018-04-07 ENCOUNTER — Telehealth: Payer: Self-pay | Admitting: Radiology

## 2018-04-07 DIAGNOSIS — O24410 Gestational diabetes mellitus in pregnancy, diet controlled: Secondary | ICD-10-CM | POA: Diagnosis present

## 2018-04-07 DIAGNOSIS — O09523 Supervision of elderly multigravida, third trimester: Secondary | ICD-10-CM

## 2018-04-07 DIAGNOSIS — Z8751 Personal history of pre-term labor: Secondary | ICD-10-CM

## 2018-04-07 DIAGNOSIS — O26893 Other specified pregnancy related conditions, third trimester: Secondary | ICD-10-CM | POA: Diagnosis present

## 2018-04-07 DIAGNOSIS — O99214 Obesity complicating childbirth: Secondary | ICD-10-CM | POA: Diagnosis present

## 2018-04-07 DIAGNOSIS — Z6791 Unspecified blood type, Rh negative: Secondary | ICD-10-CM | POA: Diagnosis not present

## 2018-04-07 DIAGNOSIS — O24420 Gestational diabetes mellitus in childbirth, diet controlled: Principal | ICD-10-CM | POA: Diagnosis present

## 2018-04-07 DIAGNOSIS — Z3A37 37 weeks gestation of pregnancy: Secondary | ICD-10-CM

## 2018-04-07 DIAGNOSIS — E669 Obesity, unspecified: Secondary | ICD-10-CM | POA: Diagnosis present

## 2018-04-07 DIAGNOSIS — O34219 Maternal care for unspecified type scar from previous cesarean delivery: Secondary | ICD-10-CM | POA: Diagnosis not present

## 2018-04-07 DIAGNOSIS — Z98891 History of uterine scar from previous surgery: Secondary | ICD-10-CM

## 2018-04-07 DIAGNOSIS — O0990 Supervision of high risk pregnancy, unspecified, unspecified trimester: Secondary | ICD-10-CM

## 2018-04-07 DIAGNOSIS — O26899 Other specified pregnancy related conditions, unspecified trimester: Secondary | ICD-10-CM

## 2018-04-07 LAB — CBC
HCT: 37.7 % (ref 36.0–46.0)
Hemoglobin: 12.9 g/dL (ref 12.0–15.0)
MCH: 30.9 pg (ref 26.0–34.0)
MCHC: 34.2 g/dL (ref 30.0–36.0)
MCV: 90.4 fL (ref 78.0–100.0)
PLATELETS: 217 10*3/uL (ref 150–400)
RBC: 4.17 MIL/uL (ref 3.87–5.11)
RDW: 12.4 % (ref 11.5–15.5)
WBC: 8.8 10*3/uL (ref 4.0–10.5)

## 2018-04-07 LAB — POCT FERN TEST: POCT Fern Test: POSITIVE

## 2018-04-07 LAB — RPR: RPR Ser Ql: NONREACTIVE

## 2018-04-07 LAB — TYPE AND SCREEN
ABO/RH(D): AB NEG
ANTIBODY SCREEN: NEGATIVE

## 2018-04-07 MED ORDER — ACETAMINOPHEN 325 MG PO TABS: 650.0000 mg | ORAL_TABLET | ORAL | Status: DC | PRN

## 2018-04-07 MED ORDER — OXYCODONE-ACETAMINOPHEN 5-325 MG PO TABS: 2.0000 | ORAL_TABLET | ORAL | Status: DC | PRN

## 2018-04-07 MED ORDER — FENTANYL 2.5 MCG/ML BUPIVACAINE 1/10 % EPIDURAL INFUSION (WH - ANES)
14.0000 mL/h | INTRAMUSCULAR | Status: DC | PRN
  Administered 2018-04-07: 14 mL/h via EPIDURAL
  Filled 2018-04-07: qty 100

## 2018-04-07 MED ORDER — EPHEDRINE 5 MG/ML INJ
10.0000 mg | INTRAVENOUS | Status: DC | PRN
  Filled 2018-04-07: qty 2

## 2018-04-07 MED ORDER — SENNOSIDES-DOCUSATE SODIUM 8.6-50 MG PO TABS
2.0000 | ORAL_TABLET | ORAL | Status: DC
  Administered 2018-04-07 – 2018-04-09 (×2): 2 via ORAL
  Filled 2018-04-07 (×2): qty 2

## 2018-04-07 MED ORDER — TERBUTALINE SULFATE 1 MG/ML IJ SOLN
0.2500 mg | Freq: Once | INTRAMUSCULAR | Status: DC | PRN
  Filled 2018-04-07: qty 1

## 2018-04-07 MED ORDER — WITCH HAZEL-GLYCERIN EX PADS: 1.0000 "application " | MEDICATED_PAD | CUTANEOUS | Status: DC | PRN

## 2018-04-07 MED ORDER — PHENYLEPHRINE 40 MCG/ML (10ML) SYRINGE FOR IV PUSH (FOR BLOOD PRESSURE SUPPORT)
80.0000 ug | PREFILLED_SYRINGE | INTRAVENOUS | Status: DC | PRN
  Filled 2018-04-07: qty 5
  Filled 2018-04-07: qty 10

## 2018-04-07 MED ORDER — LACTATED RINGERS IV SOLN
500.0000 mL | Freq: Once | INTRAVENOUS | Status: AC
  Administered 2018-04-07: 500 mL via INTRAVENOUS

## 2018-04-07 MED ORDER — LIDOCAINE HCL (PF) 1 % IJ SOLN
30.0000 mL | INTRAMUSCULAR | Status: DC | PRN
  Filled 2018-04-07: qty 30

## 2018-04-07 MED ORDER — LACTATED RINGERS IV SOLN
INTRAVENOUS | Status: DC
  Administered 2018-04-07 (×2): via INTRAVENOUS

## 2018-04-07 MED ORDER — ONDANSETRON HCL 4 MG/2ML IJ SOLN: 4.0000 mg | INTRAMUSCULAR | Status: DC | PRN

## 2018-04-07 MED ORDER — LIDOCAINE HCL (PF) 1 % IJ SOLN
INTRAMUSCULAR | Status: DC | PRN
  Administered 2018-04-07 (×2): 4 mL via EPIDURAL

## 2018-04-07 MED ORDER — BENZOCAINE-MENTHOL 20-0.5 % EX AERO
1.0000 "application " | INHALATION_SPRAY | CUTANEOUS | Status: DC | PRN
  Administered 2018-04-07: 1 via TOPICAL
  Filled 2018-04-07: qty 56

## 2018-04-07 MED ORDER — ONDANSETRON HCL 4 MG PO TABS: 4.0000 mg | ORAL_TABLET | ORAL | Status: DC | PRN

## 2018-04-07 MED ORDER — SIMETHICONE 80 MG PO CHEW: 80.0000 mg | CHEWABLE_TABLET | ORAL | Status: DC | PRN

## 2018-04-07 MED ORDER — TERBUTALINE SULFATE 1 MG/ML IJ SOLN
INTRAMUSCULAR | Status: AC
  Filled 2018-04-07: qty 1

## 2018-04-07 MED ORDER — TETANUS-DIPHTH-ACELL PERTUSSIS 5-2.5-18.5 LF-MCG/0.5 IM SUSP: 0.5000 mL | Freq: Once | INTRAMUSCULAR | Status: DC

## 2018-04-07 MED ORDER — TERBUTALINE SULFATE 1 MG/ML IJ SOLN
0.2500 mg | Freq: Once | INTRAMUSCULAR | Status: AC
  Administered 2018-04-07: 0.25 mg via SUBCUTANEOUS

## 2018-04-07 MED ORDER — OXYTOCIN 40 UNITS IN LACTATED RINGERS INFUSION - SIMPLE MED: 1.0000 m[IU]/min | INTRAVENOUS | Status: DC

## 2018-04-07 MED ORDER — SOD CITRATE-CITRIC ACID 500-334 MG/5ML PO SOLN: 30.0000 mL | ORAL | Status: DC | PRN

## 2018-04-07 MED ORDER — DIPHENHYDRAMINE HCL 50 MG/ML IJ SOLN: 12.5000 mg | INTRAMUSCULAR | Status: DC | PRN

## 2018-04-07 MED ORDER — OXYCODONE-ACETAMINOPHEN 5-325 MG PO TABS: 1.0000 | ORAL_TABLET | ORAL | Status: DC | PRN

## 2018-04-07 MED ORDER — ONDANSETRON HCL 4 MG/2ML IJ SOLN: 4.0000 mg | Freq: Four times a day (QID) | INTRAMUSCULAR | Status: DC | PRN

## 2018-04-07 MED ORDER — ZOLPIDEM TARTRATE 5 MG PO TABS: 5.0000 mg | ORAL_TABLET | Freq: Every evening | ORAL | Status: DC | PRN

## 2018-04-07 MED ORDER — DIBUCAINE 1 % RE OINT: 1.0000 "application " | TOPICAL_OINTMENT | RECTAL | Status: DC | PRN

## 2018-04-07 MED ORDER — OXYTOCIN 40 UNITS IN LACTATED RINGERS INFUSION - SIMPLE MED
2.5000 [IU]/h | INTRAVENOUS | Status: DC
  Filled 2018-04-07: qty 1000

## 2018-04-07 MED ORDER — PHENYLEPHRINE 40 MCG/ML (10ML) SYRINGE FOR IV PUSH (FOR BLOOD PRESSURE SUPPORT)
80.0000 ug | PREFILLED_SYRINGE | INTRAVENOUS | Status: DC | PRN
  Administered 2018-04-07 (×2): 80 ug via INTRAVENOUS
  Filled 2018-04-07: qty 5

## 2018-04-07 MED ORDER — PRENATAL MULTIVITAMIN CH
1.0000 | ORAL_TABLET | Freq: Every day | ORAL | Status: DC
  Administered 2018-04-07 – 2018-04-08 (×2): 1 via ORAL
  Filled 2018-04-07 (×3): qty 1

## 2018-04-07 MED ORDER — DIPHENHYDRAMINE HCL 25 MG PO CAPS: 25.0000 mg | ORAL_CAPSULE | Freq: Four times a day (QID) | ORAL | Status: DC | PRN

## 2018-04-07 MED ORDER — COCONUT OIL OIL: 1.0000 "application " | TOPICAL_OIL | Status: DC | PRN

## 2018-04-07 MED ORDER — FLEET ENEMA 7-19 GM/118ML RE ENEM: 1.0000 | ENEMA | RECTAL | Status: DC | PRN

## 2018-04-07 MED ORDER — IBUPROFEN 600 MG PO TABS
600.0000 mg | ORAL_TABLET | Freq: Four times a day (QID) | ORAL | Status: DC
  Administered 2018-04-07 – 2018-04-09 (×8): 600 mg via ORAL
  Filled 2018-04-07 (×8): qty 1

## 2018-04-07 MED ORDER — LACTATED RINGERS IV SOLN: 500.0000 mL | INTRAVENOUS | Status: DC | PRN

## 2018-04-07 MED ORDER — OXYTOCIN BOLUS FROM INFUSION
500.0000 mL | Freq: Once | INTRAVENOUS | Status: AC
  Administered 2018-04-07: 500 mL via INTRAVENOUS

## 2018-04-07 NOTE — Anesthesia Postprocedure Evaluation (Signed)
Anesthesia Post Note  Patient: Alexandra Douglas  Procedure(s) Performed: AN AD [place]     Patient location during evaluation: Mother Baby Anesthesia Type: Epidural Level of consciousness: awake and alert Pain management: pain level controlled Vital Signs Assessment: post-procedure vital signs reviewed and stable Respiratory status: spontaneous breathing, nonlabored ventilation and respiratory function stable Cardiovascular status: stable Postop Assessment: no headache, no backache and epidural receding Anesthetic complications: no    Last Vitals:  Vitals:   04/07/18 0845 04/07/18 0950  BP: 95/73 108/71  Pulse: (!) 53 (!) 55  Resp: 18 18  Temp: 36.4 C 36.4 C  SpO2:      Last Pain:  Vitals:   04/07/18 1135  TempSrc:   PainSc: 0-No pain   Pain Goal:                 Riki Sheer

## 2018-04-07 NOTE — MAU Note (Signed)
PT SAYS SROM AT 2300.  VE- ? RESULTS.  NO HSV ANR MRSA. GBS- NEG

## 2018-04-07 NOTE — Lactation Note (Signed)
This note was copied from a baby's chart. Lactation Consultation Note  Patient Name: Alexandra Douglas [id number] Date: 04/07/2018 Reason for consult: Initial assessment;Early term 20-38.6wks  Baby is 17 hours old,  As LC entered the room , baby STS on moms chest sound asleep.  Mom mentioned she had last attempted to latch at 1:30 pm and he sucked for about 5 mins,  Then she spoon fed approx small amount. Baby has made some efforts to latch,not more than 5 mins or  Not at all. After mom hand expressed 3 drops, LC swabbed the baby's mouth with a gloved finger.  Baby sucked the EBM off the gloved finger and went back to sleep.  LC reviewed potential feeding behaviors of  early term infant and gave milk the hand out to read.  LC recommended and encouraged mom to hand express in between feedings and prior to latch due  To some areola - breast massage, hand express, pre-pump with hand pump to make the nipple areola  Complex more elastic for a deeper latch. ( [place] instructed mom on the use of hand pump and shells)  LC showed mom how do do the reverse pressure after massage, hand express, and pre-pump.  Mom able to hand express , needed some assistance.  Per mom will have a DEBP at home ( [employer] ).  Mother informed of post-discharge support and given phone number to the lactation department, including services for phone call assistance; out-patient appointments; and breastfeeding support group. List of other breastfeeding resources in the community given in the handout. Encouraged mother to call for problems or concerns related to breastfeeding.    Maternal Data Has patient been taught Hand Expression?: Yes Does the patient have breastfeeding experience prior to this delivery?: Yes  Feeding Feeding Type: (last attempted at 1330 / STS ) Length of feed: 5 min(sleepy sucked some  )  LATCH Score                   Interventions Interventions: Breast feeding basics reviewed  Lactation  Tools Discussed/Used WIC Program: No Pump Review: Milk Storage   Consult Status Consult Status: Follow-up Date: 04/07/18 Follow-up type: In-patient    [place] 04/07/2018, 2:47 PM

## 2018-04-07 NOTE — Anesthesia Preprocedure Evaluation (Signed)
Anesthesia Evaluation  Patient identified by MRN, date of birth, ID band Patient awake    Reviewed: Allergy & Precautions, H&P , NPO status , Patient's Chart, lab work & pertinent test results  History of Anesthesia Complications (+) PONV and history of anesthetic complications  Airway Mallampati: II  TM Distance: >3 FB Neck ROM: full    Dental no notable dental hx.    Pulmonary neg pulmonary ROS,    Pulmonary exam normal        Cardiovascular negative cardio ROS Normal cardiovascular exam Rate:Normal     Neuro/Psych negative neurological ROS  negative psych ROS   GI/Hepatic negative GI ROS, Neg liver ROS,   Endo/Other  diabetesMorbid obesity  Renal/GU negative Renal ROS     Musculoskeletal negative musculoskeletal ROS (+)   Abdominal (+) + obese,   Peds  Hematology negative hematology ROS (+)   Anesthesia Other Findings   Reproductive/Obstetrics (+) Pregnancy                             Anesthesia Physical  Anesthesia Plan  ASA: III  Anesthesia Plan: Epidural   Post-op Pain Management:    Induction:   PONV Risk Score and Plan:   Airway Management Planned:   Additional Equipment:   Intra-op Plan:   Post-operative Plan:   Informed Consent: I have reviewed the patients History and Physical, chart, labs and discussed the procedure including the risks, benefits and alternatives for the proposed anesthesia with the patient or authorized representative who has indicated his/her understanding and acceptance.     Plan Discussed with:   Anesthesia Plan Comments:         Anesthesia Quick Evaluation

## 2018-04-07 NOTE — Telephone Encounter (Signed)
Left a message of appointment time for 05/18/18 @ 8:15 for Postpartum and 2 hr GTT testing, instructed to be fasting for the lab.

## 2018-04-07 NOTE — H&P (Addendum)
OBSTETRIC ADMISSION HISTORY AND PHYSICAL  Alexandra Douglas is a 37 y.o. female G3P1103 with IUP at [redacted]w[redacted]d by LMP and 8-wk US presenting for SOL, SROM. She reports +FMs, no VB, no blurry vision, headaches or peripheral edema, and RUQ pain.  Pregnancy complicated by GDM, diet controlled. She plans on breat feeding. She request vasectomy for birth control. She received her prenatal care at [facility]  Dating: By LMP --->  Estimated Date of Delivery: 04/28/18  Sono:    @[redacted]w[redacted]d, CWD, normal anatomy, variable presentation, 257g, 40% EFW   Prenatal History/Complications:  Past Medical History: Past Medical History:  Diagnosis Date  . Gestational diabetes   . PONV (postoperative nausea and vomiting)   . Precancerous skin lesion 07/31/2013   Goes q6m to [facility] in [place]. She states it's always been pre-cancerous.   . Preterm labor     Past Surgical History: Past Surgical History:  Procedure Laterality Date  . CESAREAN SECTION  02/06/2008  . melanoma removal  2014   clear margins  . WISDOM TOOTH EXTRACTION      Obstetrical History: OB History    Gravida  3   Para  2   Term  1   Preterm  1   AB  0   Living  3     SAB  0   TAB  0   Ectopic  0   Multiple  1   Live Births  3           Social History: Social History   Socioeconomic History  . Marital status: Married    Spouse name: Not on file  . Number of children: Not on file  . Years of education: Not on file  . Highest education level: Not on file  Occupational History  . Not on file  Social Needs  . Financial resource strain: Not on file  . Food insecurity:    Worry: Not on file    Inability: Not on file  . Transportation needs:    Medical: Not on file    Non-medical: Not on file  Tobacco Use  . Smoking status: Never Smoker  . Smokeless tobacco: Never Used  Substance and Sexual Activity  . Alcohol use: No  . Drug use: No  . Sexual activity: Yes    Partners: Male  Lifestyle  .  Physical activity:    Days per week: Not on file    Minutes per session: Not on file  . Stress: Not on file  Relationships  . Social connections:    Talks on phone: Not on file    Gets together: Not on file    Attends religious service: Not on file    Active member of club or organization: Not on file    Attends meetings of clubs or organizations: Not on file    Relationship status: Not on file  Other Topics Concern  . Not on file  Social History Narrative  . Not on file    Family History: Family History  Problem Relation Age of Onset  . Arthritis Mother   . Hyperlipidemia Mother   . Heart disease Maternal Grandfather   . Cancer Paternal Grandfather        Throat  . Cancer Paternal Grandmother        intestinal  . Anesthesia problems Neg Hx     Allergies: No Known Allergies  Medications Prior to Admission  Medication Sig Dispense Refill Last Dose  . ACCU-CHEK FASTCLIX LANCETS   MISC Please check blood sugars four timesdaily 100 each 12 Taking  . acetaminophen (TYLENOL) 500 MG tablet Take 500 mg by mouth every 6 (six) hours as needed. For pain   Taking  . Blood Glucose Monitoring Suppl (ACCU-CHEK AVIVA PLUS) w/Device KIT Check blood sugars four times a day. 1 kit 0 Taking  . glucose blood (ACCU-CHEK ACTIVE STRIPS) test strip Use as instructed 100 each 12 Taking  . hydroxyprogesterone caproate (MAKENA) 250 mg/mL OIL injection Inject 1 mL (250 mg total) into the muscle once a week. (Patient not taking: Reported on 04/06/2018) 1 mL 20 Not Taking  . Prenatal Vit-Fe Fumarate-FA (PRENATAL MULTIVITAMIN) TABS tablet Take 1 tablet by mouth daily at 12 noon.   Taking  . triamcinolone cream (KENALOG) 0.1 % Apply 1 application topically as directed.   Taking     Review of Systems   All systems reviewed and negative except as stated in HPI  Blood pressure 107/65, pulse 65, temperature 98 F (36.7 C), temperature source Oral, resp. rate 18, height 5' 3" (1.6 m), weight 100 kg, last  menstrual period 07/22/2017, SpO2 98 %. General appearance: alert, cooperative, appears stated age, no distress and mildly obese Lungs: clear to auscultation bilaterally Heart: regular rate and rhythm Abdomen: soft, non-tender; bowel sounds normal Pelvic: deferred Extremities: Homans sign is negative, no sign of DVT Presentation: cephalic Fetal monitoringBaseline: 140 bpm, Variability: Good {> 6 bpm), Accelerations: Reactive and Decelerations: Absent Uterine activityFrequency: Every 4  minutes Dilation: 8 Effacement (%): 100 Station: 0 Exam by:: Beitz RN    Prenatal labs: ABO, Rh: --/--/AB NEG (09/13 0050) Antibody: NEG (09/13 0050) Rubella: 5.02 (02/28 1000) RPR: Non Reactive (07/19 0818)  HBsAg: Negative (02/28 1000)  HIV: Non Reactive (07/19 0818)  GBS:   neg 1 hr Glucola neg Genetic screening  unknown Anatomy US normal  Prenatal Transfer Tool  Maternal Diabetes: Yes:  Diabetes Type:  Diet controlled Genetic Screening: Declined Maternal Ultrasounds/Referrals: Normal Fetal Ultrasounds or other Referrals:  Referred to Materal Fetal Medicine  Maternal Substance Abuse:  No Significant Maternal Medications:  None Significant Maternal Lab Results: None  Results for orders placed or performed during the hospital encounter of 04/07/18 (from the past 24 hour(s))  POCT fern test   Collection Time: 04/07/18 12:38 AM  Result Value Ref Range   POCT Fern Test Positive = ruptured amniotic membanes   CBC   Collection Time: 04/07/18 12:50 AM  Result Value Ref Range   WBC 8.8 4.0 - 10.5 K/uL   RBC 4.17 3.87 - 5.11 MIL/uL   Hemoglobin 12.9 12.0 - 15.0 g/dL   HCT 37.7 36.0 - 46.0 %   MCV 90.4 78.0 - 100.0 fL   MCH 30.9 26.0 - 34.0 pg   MCHC 34.2 30.0 - 36.0 g/dL   RDW 12.4 11.5 - 15.5 %   Platelets 217 150 - 400 K/uL  Type and screen [facility]   Collection Time: 04/07/18 12:50 AM  Result Value Ref Range   ABO/RH(D) AB NEG    Antibody Screen NEG     Sample Expiration      04/10/2018 Performed at [facility], [address] , [place] [address]     Patient Active Problem List   Diagnosis Date Noted  . Indication for care in labor or delivery 04/07/2018  . GDM (gestational diabetes mellitus), class A1 02/11/2018  . AMA (advanced maternal age) multigravida 35+ 09/22/2017  . History of VBAC 09/22/2017  . Obesity in pregnancy   09/22/2017  . Precancerous skin lesion 07/31/2013  . Obesity (BMI 35.0-39.9 without comorbidity) 07/31/2013  . Rh negative state in antepartum period 07/14/2011  . History of preterm delivery 06/16/2011  . Supervision of high risk pregnancy, antepartum 06/16/2011    Assessment/Plan:  Duyen Linker is a 36 y.o. G3P1103 at [redacted]w[redacted]d here for SOL  #Labor:progressing quickly, no augmentation #Pain: Epidural #FWB: Cat 1 FHT cont to monitor #ID:  GBS neg #MOF: breast #MOC:vasectomy #Circ:  outpt #gDM: BG q2h during labor   Kelsey D Ash, MD  04/07/2018, 3:31 AM   I confirm that I have verified the information documented in the resident's note and that I have also personally reperformed the physical exam and all medical decision making activities.  Veronica C Rogers, CNM 04/07/18    

## 2018-04-07 NOTE — Anesthesia Procedure Notes (Signed)
Epidural Patient location during procedure: OB Start time: 04/07/2018 2:02 AM End time: 04/07/2018 2:12 AM  Staffing Anesthesiologist: Nolon Nations, MD Performed: anesthesiologist   Preanesthetic Checklist Completed: patient identified, pre-op evaluation, timeout performed, IV checked, risks and benefits discussed and monitors and equipment checked  Epidural Patient position: sitting Prep: site prepped and draped and DuraPrep Patient monitoring: heart rate, continuous pulse ox and blood pressure Approach: midline Location: L3-L4 Injection technique: LOR air and LOR saline  Needle:  Needle type: Tuohy  Needle gauge: 17 G Needle length: 9 cm Needle insertion depth: 7 cm Catheter type: closed end flexible Catheter size: 19 Gauge Catheter at skin depth: 12 cm Test dose: negative  Assessment Sensory level: T8 Events: blood not aspirated, injection not painful, no injection resistance, negative IV test and no paresthesia  Additional Notes Reason for block:procedure for pain

## 2018-04-08 ENCOUNTER — Encounter (HOSPITAL_COMMUNITY): Payer: Self-pay | Admitting: Advanced Practice Midwife

## 2018-04-08 MED ORDER — ACETAMINOPHEN 325 MG PO TABS: 650.0000 mg | ORAL_TABLET | ORAL | 0 refills | Status: DC | PRN

## 2018-04-08 MED ORDER — IBUPROFEN 600 MG PO TABS: 600.0000 mg | ORAL_TABLET | Freq: Four times a day (QID) | ORAL | 0 refills | Status: DC

## 2018-04-08 NOTE — Discharge Summary (Addendum)
Obstetrics Discharge Summary OB/GYN Faculty [facility]   Patient Name: Alexandra Douglas DOB: 01/10/1981 MRN: [id number]  Date of admission: 04/07/2018 Delivering MD: Lajean Manes   Date of discharge: 04/08/2018  Admitting diagnosis: 77WKS WATER BROKE Intrauterine pregnancy: [redacted]w[redacted]d    Secondary diagnosis:   Active Problems:   History of preterm delivery   Supervision of high risk pregnancy, antepartum   Rh negative state in antepartum period   Obesity (BMI 35.0-39.9 without comorbidity)   History of VBAC   GDM (gestational diabetes mellitus), class A1   Indication for care in labor or delivery   SVD (spontaneous vaginal delivery)   Additional problems:  . none     Discharge diagnosis: Term Pregnancy Delivered, VBAC and GDM A1                                            Postpartum procedures: None  Complications: none  Hospital course: Alexandra Douglas a 37y.o. [id number] was admitted for SOL. Her pregnancy was complicated by GDMA1, obesity. Her labor course was notable for TOLAC. Delivery was complicated by VBAC. Please see delivery/op note for additional details. Her postpartum course was uncomplicated. She was breastfeeding without difficulty. By day of discharge, she was passing flatus, urinating, eating and drinking without difficulty. Her pain was well-controlled, and she was discharged home with tylenol/motrin. She will follow-up in clinic in 4 weeks.   Physical exam  Vitals:   04/07/18 1355 04/07/18 1740 04/07/18 2200 04/08/18 0617  BP: 98/72 107/77 108/79 108/70  Pulse: 64 74 91 70  Resp: 18 18 18 18   Temp: 97.7 F (36.5 C) 98.1 F (36.7 C) 98.2 F (36.8 C) 98.4 F (36.9 C)  TempSrc: Oral Oral Oral Oral  SpO2:   97% 99%  Weight:      Height:       General: AAO, NAD Lochia: appropriate Uterine Fundus: firm Incision: N/A DVT Evaluation: No evidence of DVT seen on physical exam. Labs: Lab Results  Component Value Date   WBC 8.8 04/07/2018   HGB 12.9  04/07/2018   HCT 37.7 04/07/2018   MCV 90.4 04/07/2018   PLT 217 04/07/2018   CMP Latest Ref Rng & Units 09/22/2017  Glucose 65 - 99 mg/dL 108(H)  BUN 6 - 20 mg/dL 8  Creatinine 0.57 - 1.00 mg/dL 0.55(L)  Sodium 134 - 144 mmol/L 138  Potassium 3.5 - 5.2 mmol/L 4.1  Chloride 96 - 106 mmol/L 101  CO2 20 - 29 mmol/L 23  Calcium 8.7 - 10.2 mg/dL 8.7  Total Protein 6.0 - 8.5 g/dL 6.8  Total Bilirubin 0.0 - 1.2 mg/dL 0.4  Alkaline Phos 39 - 117 IU/L 70  AST 0 - 40 IU/L 27  ALT 0 - 32 IU/L 22    Discharge instructions: Per After Visit Summary and "Baby and Me Booklet"  After visit meds:  Allergies as of 04/08/2018   No Known Allergies     Medication List    STOP taking these medications   ACCU-CHEK AVIVA PLUS w/Device Kit   ACCU-CHEK FASTCLIX LANCETS Misc   glucose blood test strip     TAKE these medications   acetaminophen 325 MG tablet Commonly known as:  TYLENOL Take 2 tablets (650 mg total) by mouth every 4 (four) hours as needed (for pain scale < 4).   ibuprofen 600 MG tablet Commonly known  as:  ADVIL,MOTRIN Take 1 tablet (600 mg total) by mouth every 6 (six) hours.   prenatal multivitamin Tabs tablet Take 1 tablet by mouth daily at 12 noon.       Postpartum contraception: Vasectomy and Abstinence Diet: Routine Diet Activity: Advance as tolerated. Pelvic rest for 6 weeks.   Outpatient follow up:6 weeks, with GTT Follow-up Appt: Future Appointments  Date Time Provider [place]  05/18/2018  8:15 AM Aletha Halim, MD [facility]   Follow-up Visit:No follow-ups on file.  Newborn Data: Live born female  Birth Weight: 6 lb 0.3 oz (2730 g) APGAR: 8, 9  Newborn Delivery   Birth date/time:  04/07/2018 06:38:00 Delivery type:  Vaginal, Spontaneous     Baby Feeding: Bottle and Breast Disposition:home with mother  CNM attestation I have seen and examined this patient and agree with above documentation in the resident's note.    Alexandra Douglas is a 37 y.o. [id number] s/p VBAC.   Pain is well controlled.  Plan for birth control is abstinence, vasectomy.  Method of Feeding: breast  PE:  BP 108/70 (BP Location: Left Arm)   Pulse 70   Temp 98.4 F (36.9 C) (Oral)   Resp 18   Ht 5' 3"  (1.6 m)   Wt 100 kg   LMP 07/22/2017 (LMP Unknown)   SpO2 99%   Breastfeeding? Unknown   BMI 39.06 kg/m  Fundus firm  Recent Labs    04/07/18 0050  HGB 12.9  HCT 37.7     Plan: discharge today - postpartum care discussed - f/u clinic in 6 weeks for postpartum visit, including GTT   Serita Grammes, CNM 9:08 AM  04/08/2018

## 2018-04-08 NOTE — Lactation Note (Signed)
This note was copied from a baby's chart. Lactation Consultation Note  Patient Name: Alexandra Douglas Date: 04/08/2018   [facility] Follow Up Visit:  Mother holding baby STS on chest when I arrived.  Mother has attempted to latch one more time since 1200 but baby not interested.  She verbalized that the pediatrician will return later today to assess feeding to determine if mother will be discharged tonight.  Mother will call for assistance when baby awakens.                  Keneisha Heckart R Shamona Wirtz 04/08/2018, 4:35 PM

## 2018-04-08 NOTE — Progress Notes (Signed)
Discontinued discharge order for mother. Infant is staying due to poor feeding. Will see mother for PP rounds tomorrow and plan to d/c.   Phill Myron, D.O. [facility] Fellow, [facility] for [facility], [facility] 04/08/2018, 6:14 PM

## 2018-04-08 NOTE — Discharge Instructions (Signed)
Vaginal Delivery, Care After °Refer to this sheet in the next few weeks. These instructions provide you with information about caring for yourself after vaginal delivery. Your health care provider may also give you more specific instructions. Your treatment has been planned according to current medical practices, but problems sometimes occur. Call your health care provider if you have any problems or questions. °What can I expect after the procedure? °After vaginal delivery, it is common to have: °· Some bleeding from your vagina. °· Soreness in your abdomen, your vagina, and the area of skin between your vaginal opening and your anus (perineum). °· Pelvic cramps. °· Fatigue. ° °Follow these instructions at home: °Medicines °· Take over-the-counter and prescription medicines only as told by your health care provider. °· If you were prescribed an antibiotic medicine, take it as told by your health care provider. Do not stop taking the antibiotic until it is finished. °Driving ° °· Do not drive or operate heavy machinery while taking prescription pain medicine. °· Do not drive for 24 hours if you received a sedative. °Lifestyle °· Do not drink alcohol. This is especially important if you are breastfeeding or taking medicine to relieve pain. °· Do not use tobacco products, including cigarettes, chewing tobacco, or e-cigarettes. If you need help quitting, ask your health care provider. °Eating and drinking °· Drink at least 8 eight-ounce glasses of water every day unless you are told not to by your health care provider. If you choose to breastfeed your baby, you may need to drink more water than this. °· Eat high-fiber foods every day. These foods may help prevent or relieve constipation. High-fiber foods include: °? Whole grain cereals and breads. °? Brown rice. °? Beans. °? Fresh fruits and vegetables. °Activity °· Return to your normal activities as told by your health care provider. Ask your health care provider  what activities are safe for you. °· Rest as much as possible. Try to rest or take a nap when your baby is sleeping. °· Do not lift anything that is heavier than your baby or 10 lb (4.5 kg) until your health care provider says that it is safe. °· Talk with your health care provider about when you can engage in sexual activity. This may depend on your: °? Risk of infection. °? Rate of healing. °? Comfort and desire to engage in sexual activity. °Vaginal Care °· If you have an episiotomy or a vaginal tear, check the area every day for signs of infection. Check for: °? More redness, swelling, or pain. °? More fluid or blood. °? Warmth. °? Pus or a bad smell. °· Do not use tampons or douches until your health care provider says this is safe. °· Watch for any blood clots that may pass from your vagina. These may look like clumps of dark red, brown, or black discharge. °General instructions °· Keep your perineum clean and dry as told by your health care provider. °· Wear loose, comfortable clothing. °· Wipe from front to back when you use the toilet. °· Ask your health care provider if you can shower or take a bath. If you had an episiotomy or a perineal tear during labor and delivery, your health care provider may tell you not to take baths for a certain length of time. °· Wear a bra that supports your breasts and fits you well. °· If possible, have someone help you with household activities and help care for your baby for at least a few days after   you leave the hospital. °· Keep all follow-up visits for you and your baby as told by your health care provider. This is important. °Contact a health care provider if: °· You have: °? Vaginal discharge that has a bad smell. °? Difficulty urinating. °? Pain when urinating. °? A sudden increase or decrease in the frequency of your bowel movements. °? More redness, swelling, or pain around your episiotomy or vaginal tear. °? More fluid or blood coming from your episiotomy or  vaginal tear. °? Pus or a bad smell coming from your episiotomy or vaginal tear. °? A fever. °? A rash. °? Little or no interest in activities you used to enjoy. °? Questions about caring for yourself or your baby. °· Your episiotomy or vaginal tear feels warm to the touch. °· Your episiotomy or vaginal tear is separating or does not appear to be healing. °· Your breasts are painful, hard, or turn red. °· You feel unusually sad or worried. °· You feel nauseous or you vomit. °· You pass large blood clots from your vagina. If you pass a blood clot from your vagina, save it to show to your health care provider. Do not flush blood clots down the toilet without having your health care provider look at them. °· You urinate more than usual. °· You are dizzy or light-headed. °· You have not breastfed at all and you have not had a menstrual period for 12 weeks after delivery. °· You have stopped breastfeeding and you have not had a menstrual period for 12 weeks after you stopped breastfeeding. °Get help right away if: °· You have: °? Pain that does not go away or does not get better with medicine. °? Chest pain. °? Difficulty breathing. °? Blurred vision or spots in your vision. °? Thoughts about hurting yourself or your baby. °· You develop pain in your abdomen or in one of your legs. °· You develop a severe headache. °· You faint. °· You bleed from your vagina so much that you fill two sanitary pads in one hour. °This information is not intended to replace advice given to you by your health care provider. Make sure you discuss any questions you have with your health care provider. °Document Released: 07/09/2000 Document Revised: 12/24/2015 Document Reviewed: 07/27/2015 °Elsevier Interactive Patient Education © 2018 [employer] ° °

## 2018-04-08 NOTE — Lactation Note (Signed)
This note was copied from a baby's chart. Lactation Consultation Note  Patient Name: Alexandra Douglas [id number] Date: 04/08/2018 Reason for consult: Follow-up assessment;Difficult latch;Early term 22-38.6wks  P4 mother whose infant is now 56 hours old.  RN called for latch assistance.  Baby was getting an assessment completed when I entered the room.  Mother was ready to breast feed.  She prefers the cross cradle position and was sitting in the chair.  Offered to assist with latch and she accepted.  Mother's breasts are soft and non tender and nipples are everted.  Assisted to latch in the cross cradle position without difficulty.  Baby had a wide open gape and flanged lips at the breast.  He would take a few good sucks and stop.  Gentle stimulation needed to encourage him to continue which usually caused him to become restless at the breast.  He would self release and mother would latch again.  Reminded her to get a deep latch and to keep him in tight to the breast tissue.    She also tried the cross cradle on the opposite breast and the football position with the same results.  It is my recommendation that baby should stay an extra night and practice breast feeding.  I feel like mother and baby both have good technique but need another night to help perfect the skill of latching and actively sucking.  Mother will use the DEBP after every feed and feed back any EBM she obtains with pumping.  She will call me for assistance in selecting her tool for supplementation.  RN updated.   Maternal Data Formula Feeding for Exclusion: No Has patient been taught Hand Expression?: Yes Does the patient have breastfeeding experience prior to this delivery?: Yes  Feeding Feeding Type: Breast Fed Length of feed: 15 min(on/off; few sucks each time)  LATCH Score Latch: Repeated attempts needed to sustain latch, nipple held in mouth throughout feeding, stimulation needed to elicit sucking reflex.  Audible  Swallowing: A few with stimulation  Type of Nipple: Everted at rest and after stimulation  Comfort (Breast/Nipple): Soft / non-tender  Hold (Positioning): Assistance needed to correctly position infant at breast and maintain latch.  LATCH Score: 7  Interventions Interventions: Breast feeding basics reviewed;Assisted with latch;Skin to skin;Breast massage;Position options;Support pillows;Adjust position;Breast compression;Hand pump;DEBP  Lactation Tools Discussed/Used     Consult Status Consult Status: Follow-up Date: 04/09/18 Follow-up type: In-patient    [name] 04/08/2018, 5:31 PM

## 2018-04-08 NOTE — Lactation Note (Signed)
This note was copied from a baby's chart. Lactation Consultation Note  Patient Name: Alexandra Douglas [id number] Date: 04/08/2018 Reason for consult: Follow-up assessment;Early term 68-38.6wks  P4 mother whose infant is now 32 hours old.  Mother breast fed her last child for 1 year.  She is hoping to be discharged today.  I noted on the flowsheet that baby has not had many good feeds.  RN called and she has not observed a latch today.  Baby has recently returned from a circumcision.  In speaking with mother she feels confident that she could be discharged.  She stated she has experience, has a DEBP at home and knows how to hand express and feed back EBM to baby.  Even though he was not showing feeding cues I offered to assist with latch to observe how he feeds and she accepted.  Removed swaddle and onesie and attempted to latch in the cross cradle position.  Baby remained too sleepy from circumcision to awaken and open mouth.  Encouraged mother to hold him STS and to watch for feeding cues.  Suggested she call me back if needed once he awakens to be sure he can feed adequately.  Mother felt like the last feed at 0900 went well.  I will wait to hear if pediatrician puts in a discharge order for today.  Mother will call as needed.   Maternal Data Formula Feeding for Exclusion: No Has patient been taught Hand Expression?: Yes Does the patient have breastfeeding experience prior to this delivery?: Yes  Feeding Feeding Type: Breast Fed Length of feed: 0 min  LATCH Score Latch: Too sleepy or reluctant, no latch achieved, no sucking elicited.  Audible Swallowing: None  Type of Nipple: Everted at rest and after stimulation  Comfort (Breast/Nipple): Soft / non-tender  Hold (Positioning): No assistance needed to correctly position infant at breast.  LATCH Score: 6  Interventions Interventions: Breast feeding basics reviewed;Skin to skin;Position options;Support pillows;Adjust  position;Breast massage;DEBP;Hand pump  Lactation Tools Discussed/Used WIC Program: No   Consult Status Consult Status: Complete Date: 04/08/18 Follow-up type: Call as needed    Micah Barnier R Darina Hartwell 04/08/2018, 12:11 PM

## 2018-04-09 DIAGNOSIS — O34219 Maternal care for unspecified type scar from previous cesarean delivery: Secondary | ICD-10-CM | POA: Diagnosis not present

## 2018-04-09 NOTE — Progress Notes (Signed)
Parent request formula to supplement breast feeding due to infants poor feeding at the breast.  Infant has been crying all night , but when gets to the breast only eats about 10 minutes, then falls asleep.  Parents are concerned about infants elevated jaundice levels and weight loss. Parents have been informed of small tummy size of newborn, taught hand expression and understand the possible consequences of formula to the health of the infant. The possible consequences shared with patient include 1) Loss of confidence in breastfeeding 2) Engorgement 3) Allergic sensitization of baby(asthma/allergies) and 4) decreased milk supply for mother.After discussion of the above the mother decided to  supplement with formula.  The tool used to give formula supplement will be bottle with slow flow nipple given by dad.   Mother counseled to avoid artificial nipples because this practice may lead to latch difficulties,inadequate milk transfer and nipple soreness.

## 2018-04-09 NOTE — Lactation Note (Signed)
This note was copied from a baby's chart. Lactation Consultation Note: Experienced BF mom reports baby is feeding better today. Has been spoon feeding EBM and a little formula after nursing. Reports breasts are feeling a little fuller this morning. Has Medela pump for home. Reports she is getting more with hand expression than the pump. No questions at present. Reviewed our phone number, OP appointments and BFSG as resources for support after DC. To call prn  Patient Name: [name] [id number] Date: 04/09/2018 Reason for consult: Follow-up assessment;Early term 37-38.6wks   Maternal Data Formula Feeding for Exclusion: No Has patient been taught Hand Expression?: Yes Does the patient have breastfeeding experience prior to this delivery?: Yes  Feeding LATCH Score                   Interventions    Lactation Tools Discussed/Used WIC Program: No   Consult Status Consult Status: Complete    Truddie Crumble 04/09/2018, 9:06 AM

## 2018-04-09 NOTE — Discharge Summary (Addendum)
Obstetrics Discharge Summary [facility]N [facility]   Patient Name: Alexandra Douglas DOB: [birth year] MRN: [id number]  Date of admission: 04/07/2018 Delivering MD: Lajean Manes   Date of discharge: 04/09/2018  Admitting diagnosis: 43WKS WATER BROKE Intrauterine pregnancy: [redacted]w[redacted]d    Secondary diagnosis:   Active Problems:   History of preterm delivery   Supervision of high risk pregnancy, antepartum   Rh negative state in antepartum period   Obesity (BMI 35.0-39.9 without comorbidity)   History of VBAC   GDM (gestational diabetes mellitus), class A1   Indication for care in labor or delivery   SVD (spontaneous vaginal delivery)   Additional problems:  . none     Discharge diagnosis: Term Pregnancy Delivered, VBAC and GDM A1                                            Postpartum procedures: None  Complications: none  Hospital course: KTrixy Loyolais a 37y.o. [id number] was admitted for SOL. Her pregnancy was complicated by GDMA1, obesity. Her labor course was notable for TOLAC. Delivery was complicated by VBAC. Please see delivery/op note for additional details. Her postpartum course was uncomplicated. She was breastfeeding without difficulty. By day of discharge, she was passing flatus, urinating, eating and drinking without difficulty. Her pain was well-controlled, and she was discharged home with tylenol/motrin. She will follow-up in clinic in 4 weeks.   Physical exam  Vitals:   04/08/18 0617 04/08/18 1448 04/08/18 2202 04/09/18 0556  BP: 108/70 112/78 125/81 122/78  Pulse: 70 78 70 62  Resp: _0 Temp: 98.4 F (36.9 C) 98.7 F (37.1 C) 98.7 F (37.1 C) 98.1 F (36.7 C)  TempSrc: Oral Oral    SpO2: 99% 98% 98%   Weight:      Height:       General: AAO, NAD Lochia: appropriate Uterine Fundus: firm Incision: N/A DVT Evaluation: No evidence of DVT seen on physical exam. Labs: Lab Results  Component Value Date   WBC 8.8 04/07/2018   HGB 12.9  04/07/2018   HCT 37.7 04/07/2018   MCV 90.4 04/07/2018   PLT 217 04/07/2018   CMP Latest Ref Rng & Units 09/22/2017  Glucose 65 - 99 mg/dL 108(H)  BUN 6 - 20 mg/dL 8  Creatinine 0.57 - 1.00 mg/dL 0.55(L)  Sodium 134 - 144 mmol/L 138  Potassium 3.5 - 5.2 mmol/L 4.1  Chloride 96 - 106 mmol/L 101  CO2 20 - 29 mmol/L 23  Calcium 8.7 - 10.2 mg/dL 8.7  Total Protein 6.0 - 8.5 g/dL 6.8  Total Bilirubin 0.0 - 1.2 mg/dL 0.4  Alkaline Phos 39 - 117 IU/L 70  AST 0 - 40 IU/L 27  ALT 0 - 32 IU/L 22    Discharge instructions: Per After Visit Summary and "Baby and Me Booklet"  After visit meds:  Allergies as of 04/09/2018   No Known Allergies     Medication List    STOP taking these medications   ACCU-CHEK AVIVA PLUS w/Device Kit   ACCU-CHEK FASTCLIX LANCETS Misc   glucose blood test strip     TAKE these medications   acetaminophen 325 MG tablet Commonly known as:  TYLENOL Take 2 tablets (650 mg total) by mouth every 4 (four) hours as needed (for pain scale < 4).   ibuprofen 600 MG tablet Commonly known  as:  ADVIL,MOTRIN Take 1 tablet (600 mg total) by mouth every 6 (six) hours.   prenatal multivitamin Tabs tablet Take 1 tablet by mouth daily at 12 noon.       Postpartum contraception: Vasectomy and Abstinence Diet: Routine Diet Activity: Advance as tolerated. Pelvic rest for 6 weeks.   Outpatient follow up:6 weeks, with GTT Follow-up Appt: Future Appointments  Date Time Provider [place]  05/18/2018  8:15 AM Aletha Halim, MD [facility]   Follow-up Visit:No follow-ups on file.  Newborn Data: Live born female  Birth Weight: 6 lb 0.3 oz (2730 g) APGAR: 8, 9  Newborn Delivery   Birth date/time:  04/07/2018 06:38:00 Delivery type:  Vaginal, Spontaneous     Baby Feeding: Bottle and Breast Disposition:home with mother  Plan: discharge today - postpartum care discussed - f/u clinic in 6 weeks for postpartum visit, including GTT   Demetrius Revel, MD 7:09 AM  04/09/2018   Midwife attestation I have seen and examined this patient and agree with above documentation in the resident's note.   Aleicia Kenagy is a 37 y.o. [id number] s/p VBAC.  Pain is well controlled. Plan for birth control is abstinence, vasectomy. Method of Feeding: breast and formula  PE:  Gen: well appearing Heart: reg rate Lungs: normal WOB Fundus firm Ext: no pain, no edema  Recent Labs    04/07/18 0050  HGB 12.9  HCT 37.7     Assessment S/p VBAC PPD # 2  Plan: - discharge home - postpartum care discussed - f/u clinic in 6 weeks for postpartum visit   Julianne Handler, CNM 9:33 AM

## 2018-04-13 ENCOUNTER — Ambulatory Visit (HOSPITAL_COMMUNITY): Payer: BC Managed Care – PPO

## 2018-04-14 ENCOUNTER — Encounter: Payer: BC Managed Care – PPO | Admitting: Obstetrics and Gynecology

## 2018-04-20 ENCOUNTER — Encounter: Payer: BC Managed Care – PPO | Admitting: Obstetrics & Gynecology

## 2018-04-20 ENCOUNTER — Ambulatory Visit (HOSPITAL_COMMUNITY): Payer: BC Managed Care – PPO

## 2018-04-24 ENCOUNTER — Telehealth: Payer: Self-pay

## 2018-04-24 NOTE — Telephone Encounter (Signed)
-----   Message from Blanchie Dessert, [place] sent at 04/24/2018 10:58 AM EDT ----- Regarding: yeast infection rx Patient is requesting a rx sent to [facility] in [place] for yeast infection.  Patient is breast feeding

## 2018-04-24 NOTE — Telephone Encounter (Signed)
Returned patient's call re ABx for yeast infection - LMOVM in detail advising patient to try OTC Monostat 7 as directed for 7 days since she's nursing - contact office if no resolution.

## 2018-04-27 ENCOUNTER — Ambulatory Visit (HOSPITAL_COMMUNITY): Payer: BC Managed Care – PPO

## 2018-05-18 ENCOUNTER — Encounter: Payer: Self-pay | Admitting: Obstetrics and Gynecology

## 2018-05-18 ENCOUNTER — Ambulatory Visit (INDEPENDENT_AMBULATORY_CARE_PROVIDER_SITE_OTHER): Payer: BC Managed Care – PPO | Admitting: Obstetrics and Gynecology

## 2018-05-18 VITALS — BP 109/79 | HR 71 | Wt 212.0 lb

## 2018-05-18 DIAGNOSIS — Z1389 Encounter for screening for other disorder: Secondary | ICD-10-CM

## 2018-05-18 DIAGNOSIS — O24419 Gestational diabetes mellitus in pregnancy, unspecified control: Secondary | ICD-10-CM

## 2018-05-18 IMAGING — US US MFM OB DETAIL+14 WK
1 series · 14 of 28 positions shown · non-contrast
Comparison: none

[Series 1: us mfm ob detail+14 wk · 88 acquisitions, 14 frames shown]
[im 4/88]
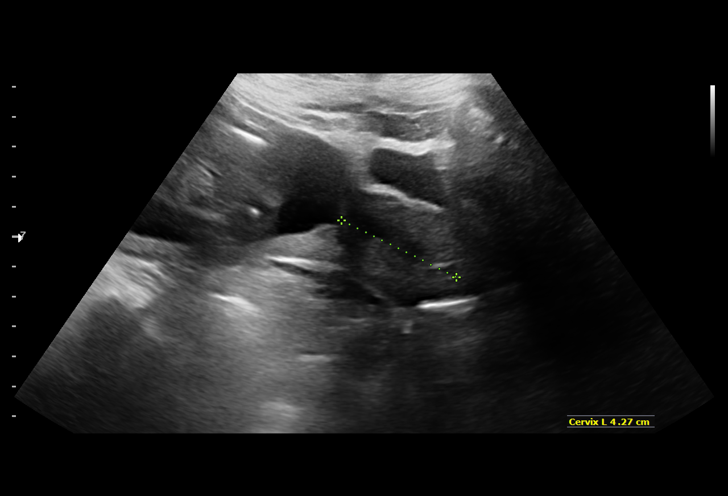
[im 10/88]
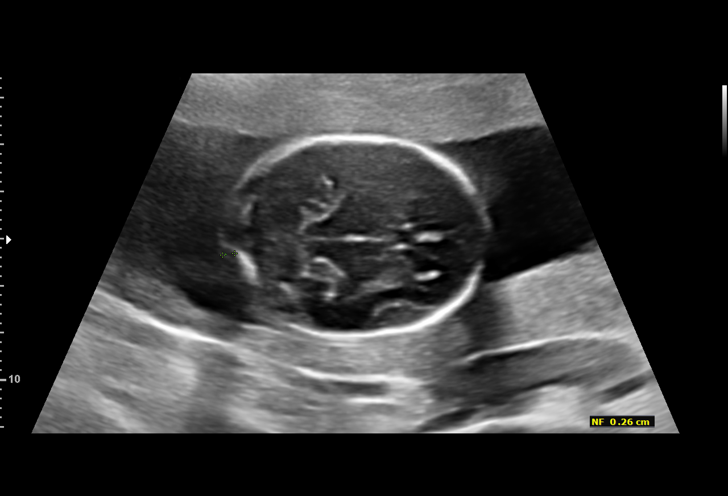
[im 17/88]
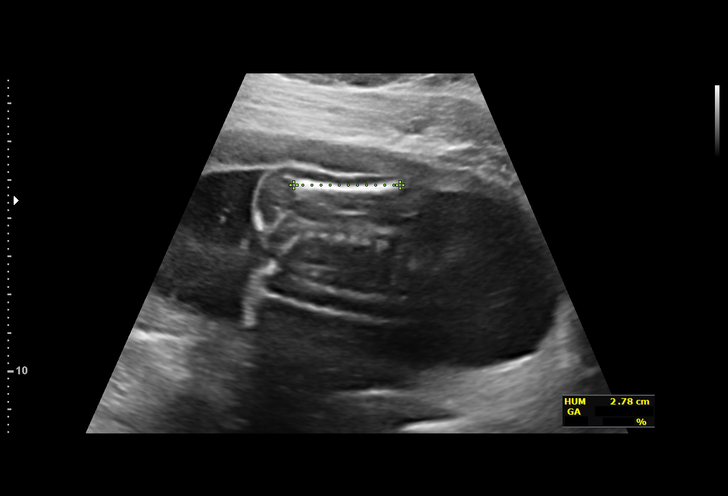
[im 23/88]
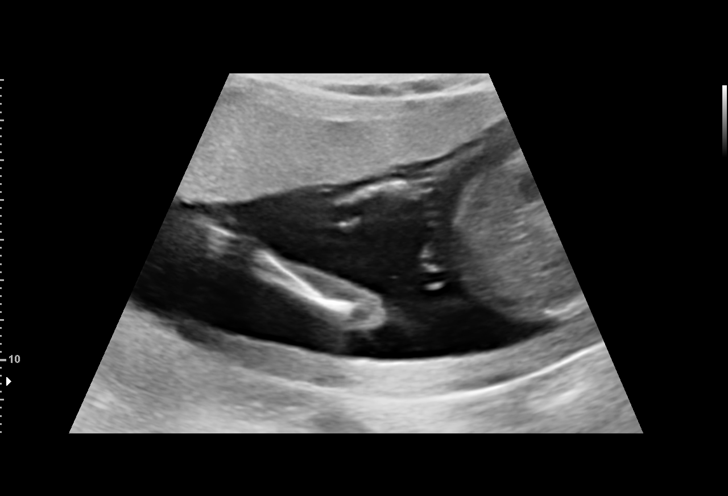
[im 30/88]
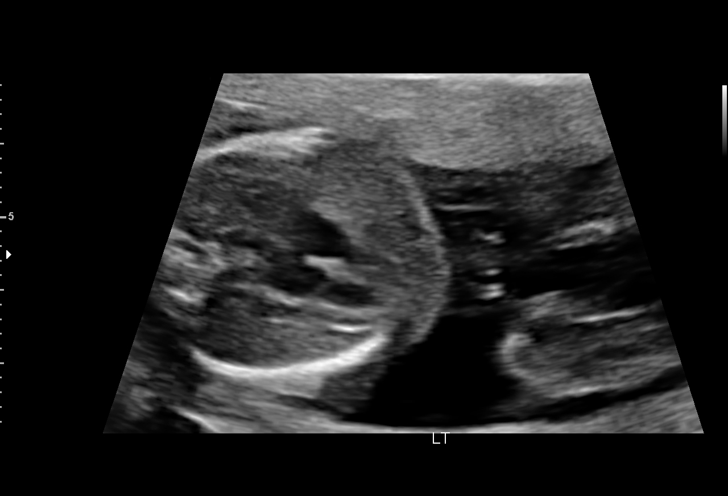
[im 36/88]
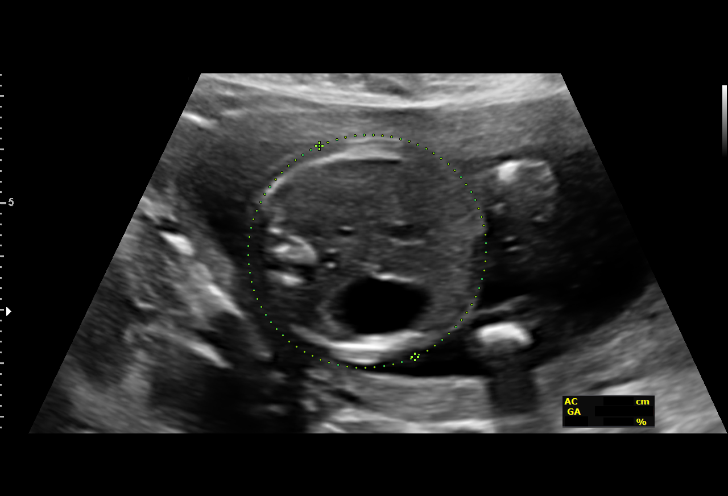
[im 42/88]
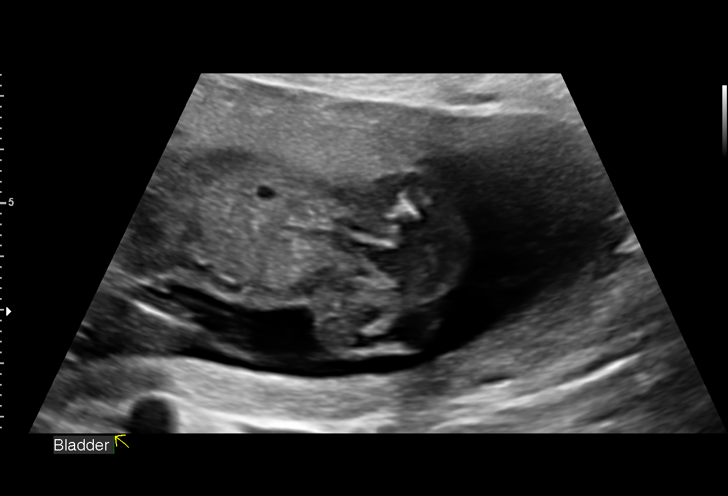
[im 49/88]
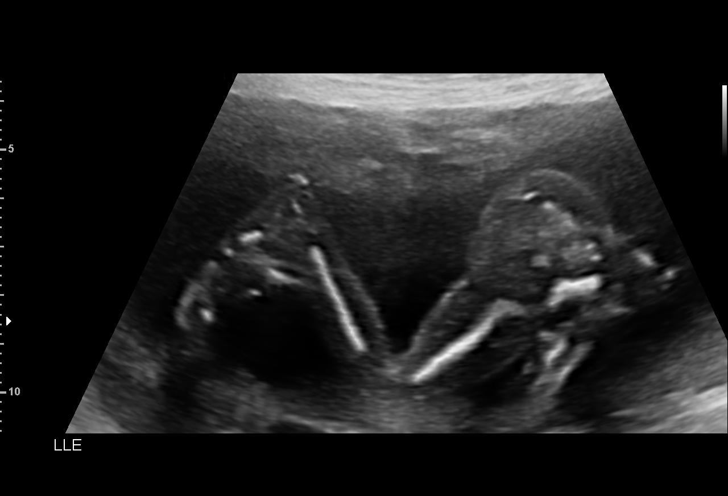
[im 55/88]
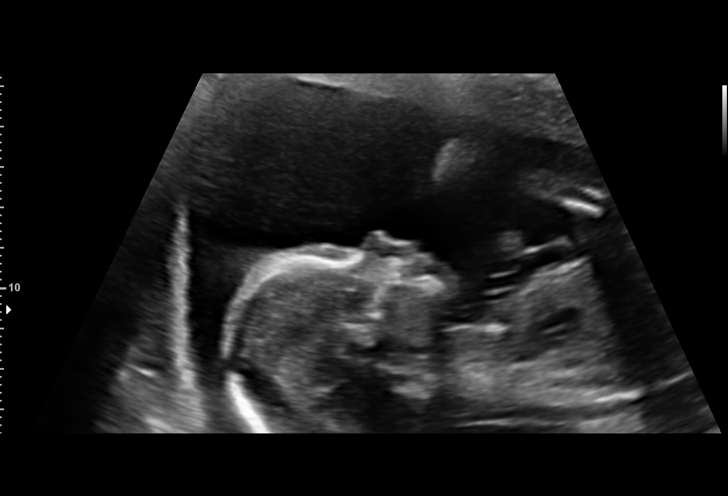
[im 62/88]
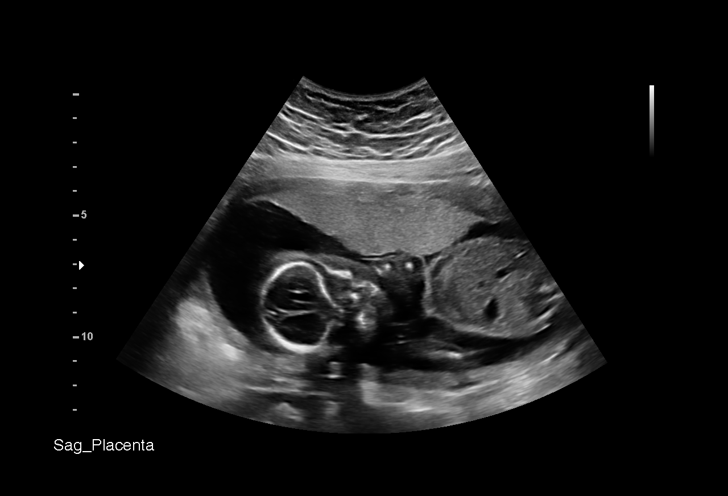
[im 68/88]
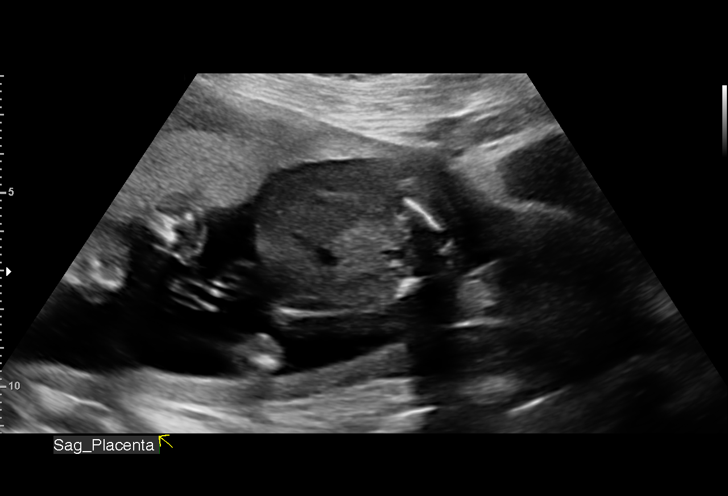
[im 75/88]
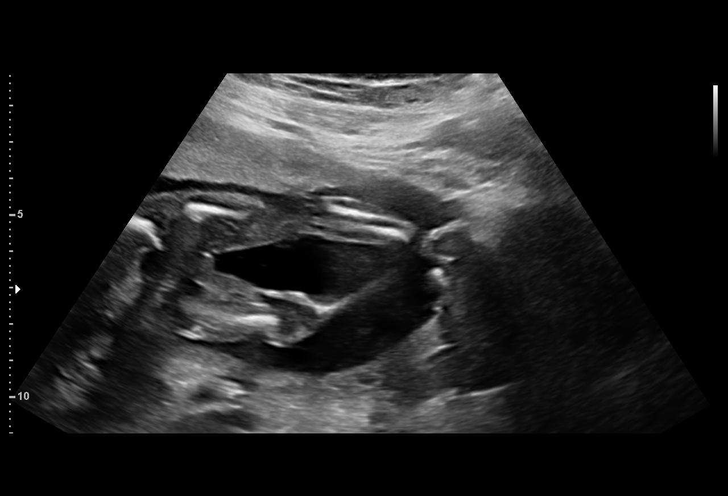
[im 81/88]
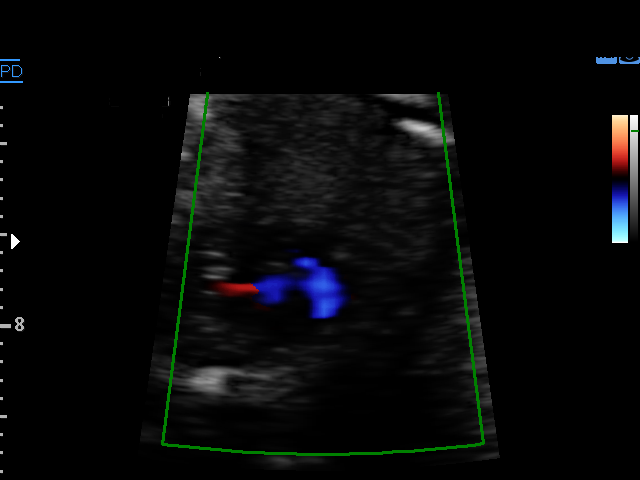
[im 88/88]
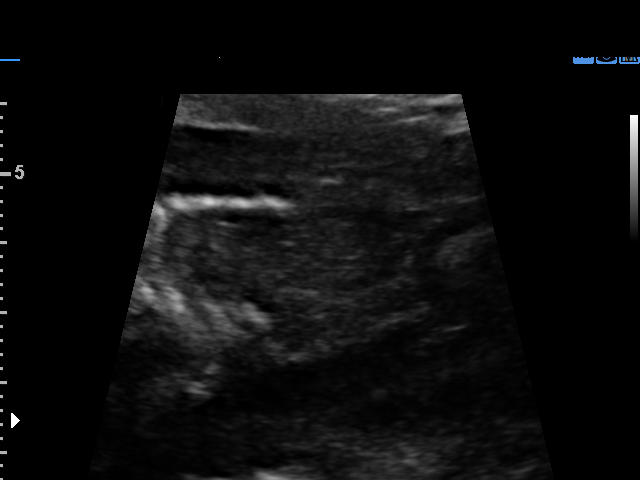

[14 of 28 positions shown; findings below may reference images not displayed]

[REDACTED]

1  DREAM DUE          [id number]      [id number]     [id number]
Indications

19 weeks gestation of pregnancy
Encounter for antenatal screening for
malformations
Advanced maternal age multigravida 35+,
second trimester (low risk NIPS)
History of cesarean delivery, currently
pregnant (with twins; successful VBAC)
Poor obstetric history: Previous preterm
delivery, antepartum (twins @29 weeks)
OB History

Gravidity:    3         Term:   1        Prem:   1
Living:       3
Fetal Evaluation

Num Of Fetuses:     1
Fetal Heart         153
Rate(bpm):
Cardiac Activity:   Observed
Presentation:       Variable
Placenta:           Anterior, above cervical os
P. Cord Insertion:  Visualized, central
Amniotic Fluid
AFI FV:      Subjectively within normal limits

Largest Pocket(cm)
7.0
Biometry

BPD:      42.4  mm     G. Age:  18w 6d         43  %    CI:         73.86  %    70 - 86
FL/HC:       17.7  %    16.1 -
HC:      156.7  mm     G. Age:  18w 5d         23  %    HC/AC:       1.15       1.09 -
AC:      136.6  mm     G. Age:  19w 0d         49  %    FL/BPD:      65.3  %
FL:       27.7  mm     G. Age:  18w 3d         25  %    FL/AC:       20.3  %    20 - 24
HUM:      26.8  mm     G. Age:  18w 4d         37  %
CER:      19.7  mm     G. Age:  18w 6d         46  %
NFT:       2.6  mm
CM:        4.7  mm

Est. FW:     257   gm     0 lb 9 oz     40  %
Gestational Age

LMP:           19w 0d        Date:  07/22/17                 EDD:    04/28/18
U/S Today:     18w 5d                                        EDD:    04/30/18
Best:          19w 0d     Det. By:  LMP  (07/22/17)          EDD:    04/28/18
Anatomy

Cranium:               Appears normal         Aortic Arch:            Appears normal
Cavum:                 Appears normal         Ductal Arch:            Appears normal
Ventricles:            Appears normal         Diaphragm:              Appears normal
Choroid Plexus:        Appears normal         Stomach:                Appears normal, left
sided
Cerebellum:            Appears normal         Abdomen:                Appears normal
Posterior Fossa:       Appears normal         Abdominal Wall:         Appears nml (cord
insert, abd wall)
Nuchal Fold:           Appears normal         Cord Vessels:           Appears normal (3
vessel cord)
Face:                  Appears normal         Kidneys:                Appear normal
(orbits and profile)
Lips:                  Appears normal         Bladder:                Appears normal
Thoracic:              Appears normal         Spine:                  Appears normal
Heart:                 Appears normal         Upper Extremities:      Appears normal
(4CH, axis, and situs
RVOT:                  Not well visualized    Lower Extremities:      Appears normal
LVOT:                  Appears normal

Other:  Parents do not wish to know sex of fetus. Fetus appears to be a
male. Heels and 5th digit appears normal. Technically difficult due to
maternal habitus and fetal position.
Cervix Uterus Adnexa

Cervix
Length:            3.4  cm.
Normal appearance by transabdominal scan.

Uterus
No abnormality visualized.
Left Ovary
Within normal limits.

Right Ovary
Within normal limits.

Cul De Sac:   No free fluid seen.

Adnexa:       No abnormality visualized.
Impression

SIUP at 19+0 weeks
Normal detailed fetal anatomy; limited views of RVOT
Markers of aneuploidy: none
Normal amniotic fluid volume
Measurements consistent with LMP dating
Recommendations

Follow-up ultrasound in 4-6 weeks to complete anatomy
survey

## 2018-05-18 NOTE — Progress Notes (Signed)
Obstetrics and Gynecology Postpartum Visit  Appointment Date: 05/18/2018  OBGYN Clinic: [facility]  Primary Care Provider: [facility]  Chief Complaint:  Chief Complaint  Patient presents with  . Postpartum Care    History of Present Illness: Alexandra Douglas is a 37 y.o. Caucasian [id number] (No LMP recorded.), seen for the above chief complaint. Her past medical history is significant for h/o GDMA1, Rh negative   She is s/p VBAC/1st (repaired) on 04/07/18; she was discharged to home on PPD#2  Vaginal bleeding or discharge: No  Breast or formula feeding: breast (q2-3h) Intercourse: No  Contraception after delivery: abstinence PP depression s/s: mild Any bowel or bladder issues: No  Pap smear: no abnormalities (date: 2018)  Review of Systems:  as noted in the History of Present Illness.  Patient Active Problem List   Diagnosis Date Noted  . GDM (gestational diabetes mellitus), class A1 02/11/2018  . Precancerous skin lesion 07/31/2013  . Obesity (BMI 35.0-39.9 without comorbidity) 07/31/2013  . Supervision of high risk pregnancy, antepartum 06/16/2011    Medications Marlowe Shores had no medications administered during this visit. Current Outpatient Medications  Medication Sig Dispense Refill  . Prenatal Vit-Fe Fumarate-FA (PRENATAL MULTIVITAMIN) TABS tablet Take 1 tablet by mouth daily at 12 noon.    Marland Kitchen acetaminophen (TYLENOL) 325 MG tablet Take 2 tablets (650 mg total) by mouth every 4 (four) hours as needed (for pain scale < 4). (Patient not taking: Reported on 05/18/2018) 30 tablet 0  . ibuprofen (ADVIL,MOTRIN) 600 MG tablet Take 1 tablet (600 mg total) by mouth every 6 (six) hours. (Patient not taking: Reported on 05/18/2018) 30 tablet 0   No current facility-administered medications for this visit.     Allergies Patient has no known allergies.  Physical Exam:  BP 109/79   Pulse 71   Wt 212 lb (96.2 kg)   BMI 37.55  kg/m  Body mass index is 37.55 kg/m. General appearance: Well nourished, well developed female in no acute distress.  Respiratory:  Normal respiratory effort Abdomen: diffusely non tender to palpation, non distended Neuro/Psych:  Normal mood and affect.  Skin:  Warm and dry.  Lymphatic:  No inguinal lymphadenopathy.   Pelvic exam: is not limited by body habitus EGBUS: mild atrophy. at the posterior fourchette, there is a small skin bridge (approx 47mm in across x 13mm in width) with a small healed skin defect hole and then the perineum below that. The area isn't hypersensitive or friable Vagina: within normal limits and with scant old blood in the vault, Cervix:  no lesions or cervical motion tenderness Uterus:  nonenlarged and approximately 8 week sized Adnexa:  normal adnexa and no mass, fullness, tenderness Rectovaginal: deferred  Laboratory: none  PP Depression Screening:  EPDS 7, no thoughts of harm  Assessment: pt doing well  Plan:  *PP: routine care. Vasectomy. I d/w her that if emptying her breast q2-3h then as effective at the minipill (approx 90%). If interested in anything for [facility] in the interim, pt let us know. D/w her to use lubrication given estrogen effects with breastfeeding that often and to let us know if any s/s from the skin bridge because can do in office numbing and cut bridge and oversew edges.  *GDMA1: f/u 2h  RTC 3-11m for annual  Durene Romans MD Attending Center for [facility])

## 2018-05-22 LAB — GLUCOSE TOLERANCE, 2 HOURS
GLUCOSE FASTING GTT: 82 mg/dL (ref 65–99)
Glucose, 2 hour: 82 mg/dL (ref 65–139)

## 2018-05-25 ENCOUNTER — Encounter: Payer: Self-pay | Admitting: Obstetrics and Gynecology

## 2018-06-07 ENCOUNTER — Encounter: Payer: Self-pay | Admitting: Radiology

## 2019-06-20 ENCOUNTER — Other Ambulatory Visit: Payer: Self-pay

## 2019-06-20 DIAGNOSIS — Z20822 Contact with and (suspected) exposure to covid-19: Secondary | ICD-10-CM

## 2019-06-21 LAB — NOVEL CORONAVIRUS, NAA: SARS-CoV-2, NAA: NOT DETECTED

## 2019-07-19 ENCOUNTER — Other Ambulatory Visit: Payer: BC Managed Care – PPO

## 2021-01-02 DIAGNOSIS — F458 Other somatoform disorders: Secondary | ICD-10-CM | POA: Insufficient documentation

## 2021-01-02 DIAGNOSIS — S0300XA Dislocation of jaw, unspecified side, initial encounter: Secondary | ICD-10-CM | POA: Insufficient documentation

## 2021-01-06 ENCOUNTER — Other Ambulatory Visit: Payer: Self-pay | Admitting: Family Medicine

## 2021-01-06 DIAGNOSIS — Z1231 Encounter for screening mammogram for malignant neoplasm of breast: Secondary | ICD-10-CM

## 2021-01-14 ENCOUNTER — Ambulatory Visit
Admission: RE | Admit: 2021-01-14 | Discharge: 2021-01-14 | Disposition: A | Discharge status: Home / Self Care | Payer: BC Managed Care – PPO | Source: Ambulatory Visit | Attending: Family Medicine | Admitting: Family Medicine

## 2021-01-14 ENCOUNTER — Other Ambulatory Visit: Payer: Self-pay

## 2021-01-14 DIAGNOSIS — Z1231 Encounter for screening mammogram for malignant neoplasm of breast: Secondary | ICD-10-CM

## 2021-01-15 ENCOUNTER — Ambulatory Visit: Payer: BC Managed Care – PPO | Admitting: Obstetrics and Gynecology

## 2021-02-02 ENCOUNTER — Encounter: Payer: Self-pay | Admitting: Obstetrics and Gynecology

## 2021-02-02 ENCOUNTER — Ambulatory Visit (INDEPENDENT_AMBULATORY_CARE_PROVIDER_SITE_OTHER): Payer: BC Managed Care – PPO | Admitting: Obstetrics and Gynecology

## 2021-02-02 ENCOUNTER — Other Ambulatory Visit (HOSPITAL_COMMUNITY)
Admission: RE | Admit: 2021-02-02 | Discharge: 2021-02-02 | Disposition: A | Discharge status: Home / Self Care | Payer: BC Managed Care – PPO | Source: Ambulatory Visit | Attending: Obstetrics and Gynecology | Admitting: Obstetrics and Gynecology

## 2021-02-02 ENCOUNTER — Other Ambulatory Visit: Payer: Self-pay

## 2021-02-02 VITALS — BP 108/75 | HR 66 | Wt 222.0 lb

## 2021-02-02 DIAGNOSIS — Z113 Encounter for screening for infections with a predominantly sexual mode of transmission: Secondary | ICD-10-CM | POA: Diagnosis not present

## 2021-02-02 DIAGNOSIS — Z01419 Encounter for gynecological examination (general) (routine) without abnormal findings: Secondary | ICD-10-CM | POA: Diagnosis present

## 2021-02-02 NOTE — Progress Notes (Signed)
Obstetrics and Gynecology Annual  Patient Evaluation  Appointment Date: 02/02/2021  OBGYN Clinic: [facility]   [facility] Provider: [facility]  Referring Provider: [facility]  Chief Complaint: Annual exam   History of Present Illness: Alexandra Douglas is a 40 y.o. [phone]  seen for the above chief complaint. Her past medical history is significant for BMI 39, h/o VBAC.   Patient is doing well and without complaints   Review of Systems: A comprehensive review of systems was negative.    Patient Active Problem List   Diagnosis Date Noted   Precancerous skin lesion 07/31/2013   Obesity (BMI 35.0-39.9 without comorbidity) 07/31/2013   Supervision of high risk pregnancy, antepartum 06/16/2011    Past Medical History:  Past Medical History:  Diagnosis Date   GDM (gestational diabetes mellitus), class A1 02/11/2018   Dx on 7/19 Negative 2hr PP testing   Gestational diabetes    History of preterm delivery 06/16/2011   2019: 37wk SVD (spont labor), was on 17p G1 early 3rd trimester twins, pprom and c-section. Was on 17p for G2 TSVD. Pt amenable to doing 17p again. Start at [place]   History of VBAC 09/22/2017   x2   PONV (postoperative nausea and vomiting)    Precancerous skin lesion 07/31/2013   Goes q4m to [facility] in [place]. She states it's always been pre-cancerous.    Preterm labor    Rh negative state in antepartum period 07/14/2011   [x]  Rhogam at 28wks done 7/19 and pp prn    Past Surgical History:  Past Surgical History:  Procedure Laterality Date   CESAREAN SECTION  02/06/2008   melanoma removal  2014   clear margins   WISDOM TOOTH EXTRACTION      Past Obstetrical History:  OB History  Gravida Para Term Preterm AB Living  3 3 2 1  0 4  SAB IAB Ectopic Multiple Live Births  0 0 0 1 4    # Outcome Date GA Lbr Len/2nd Weight Sex Delivery Anes PTL Lv  3 Term 04/07/18 100w0d 05:07 / 02:31 6 lb  0.3 oz (2.73 kg) M VBAC EPI  LIV  2 Term 01/19/12 [redacted]w[redacted]d 17:08 / 02:33 7 lb 7 oz (3.374 kg) F Vag-Spont EPI  LIV     Birth Comments: none  1A Preterm 02/06/08 [redacted]w[redacted]d  2 lb 6 oz (1.077 kg) F CS-LTranv Spinal Y LIV     Birth Comments: TWINS  1B Preterm 02/06/08 [redacted]w[redacted]d  2 lb 4 oz (1.021 kg) F CS-LTranv Spinal Y LIV     Past Gynecological History: As per HPI. Periods: qmonth, regular, not heavy or painful History of Pap Smear(s): Yes.   Last pap 2018, which was negative She is currently using vasectomy for contraception.   Social History:  Social History   Socioeconomic History   Marital status: Married    Spouse name: Not on file   Number of children: Not on file   Years of education: Not on file   Highest education level: Not on file  Occupational History   Not on file  Tobacco Use   Smoking status: Never   Smokeless tobacco: Never  Substance and Sexual Activity   Alcohol use: No   Drug use: No   Sexual activity: Yes    Partners: Male    Birth control/protection: Other-see comments    Comment: vasectomy  Other Topics Concern   Not on file  Social History Narrative  Not on file   Social Determinants of Health   Financial Resource Strain: Not on file  Food Insecurity: Not on file  Transportation Needs: Not on file  Physical Activity: Not on file  Stress: Not on file  Social Connections: Not on file  Intimate Partner Violence: Not on file    Family History:  Family History  Problem Relation Age of Onset   Arthritis Mother    Hyperlipidemia Mother    Heart disease Maternal Grandfather    Cancer Paternal Grandmother        intestinal   Cancer Paternal Grandfather        Throat   Anesthesia problems Neg Hx    Breast cancer Neg Hx    Health Maintenance:  Mammogram(s): Yes.   Date: 12/2020  Medications Coraline Talwar had no medications administered during this visit. No current outpatient medications on file.   No current facility-administered medications  for this visit.    Allergies Patient has no known allergies.   Physical Exam:  BP 108/75   Pulse 66   Wt 222 lb (100.7 kg)   BMI 39.33 kg/m  Body mass index is 39.33 kg/m. General appearance: Well nourished, well developed female in no acute distress.  Neck:  Supple, normal appearance, and no thyromegaly  Cardiovascular: normal s1 and s2.  No murmurs, rubs or gallops. Respiratory:  Clear to auscultation bilateral. Normal respiratory effort Abdomen: positive bowel sounds and no masses, hernias; diffusely non tender to palpation, non distended Breasts: breasts appear normal, no suspicious masses, no skin or nipple changes or axillary nodes, and normal palpation. Neuro/Psych:  Normal mood and affect.  Skin:  Warm and dry.  Lymphatic:  No inguinal lymphadenopathy.   Pelvic exam: is not limited by body habitus EGBUS: within normal limits Vagina: within normal limits and with no blood or discharge in the vault Cervix: normal appearing cervix without tenderness, discharge or lesions. Uterus:  nonenlarged and non tender Adnexa:  normal adnexa and no mass, fullness, tenderness Rectovaginal: deferred  Laboratory: none  Radiology: none  Assessment: patient doing well  Plan:  1. Well woman exam Patient declines labs today. F/u pap smear - Cytology - PAP( [place])  RTC 1 year and PRN  Durene Romans MD Attending Center for [facility])

## 2021-02-09 LAB — CYTOLOGY - PAP
Chlamydia: NEGATIVE
Comment: NEGATIVE
Comment: NEGATIVE
Comment: NEGATIVE
Comment: NORMAL
Diagnosis: NEGATIVE
High risk HPV: NEGATIVE
Neisseria Gonorrhea: NEGATIVE
Trichomonas: NEGATIVE

## 2021-12-01 ENCOUNTER — Other Ambulatory Visit: Payer: Self-pay | Admitting: Family Medicine

## 2021-12-01 DIAGNOSIS — Z1231 Encounter for screening mammogram for malignant neoplasm of breast: Secondary | ICD-10-CM

## 2022-01-18 ENCOUNTER — Ambulatory Visit
Admission: RE | Admit: 2022-01-18 | Discharge: 2022-01-18 | Disposition: A | Discharge status: Home / Self Care | Payer: BC Managed Care – PPO | Source: Ambulatory Visit | Attending: Family Medicine | Admitting: Family Medicine

## 2022-01-18 DIAGNOSIS — Z1231 Encounter for screening mammogram for malignant neoplasm of breast: Secondary | ICD-10-CM | POA: Diagnosis present

## 2022-02-03 ENCOUNTER — Ambulatory Visit (INDEPENDENT_AMBULATORY_CARE_PROVIDER_SITE_OTHER): Payer: BC Managed Care – PPO | Admitting: Family Medicine

## 2022-02-03 ENCOUNTER — Encounter: Payer: Self-pay | Admitting: Family Medicine

## 2022-02-03 ENCOUNTER — Encounter: Payer: BC Managed Care – PPO | Admitting: Family Medicine

## 2022-02-03 VITALS — BP 119/85 | HR 76 | Ht 64.0 in | Wt 215.0 lb

## 2022-02-03 DIAGNOSIS — Z01419 Encounter for gynecological examination (general) (routine) without abnormal findings: Secondary | ICD-10-CM

## 2022-02-03 NOTE — Progress Notes (Signed)
Subjective:     Alexandra Douglas is a 41 y.o. female and is here for a comprehensive physical exam. The patient reports no problems. Regular cycles, Husband with vasectomy. Recent negative mammogram. Pap WNL 01/2021.  The following portions of the patient's history were reviewed and updated as appropriate: allergies, current medications, past family history, past medical history, past social history, past surgical history, and problem list.  Review of Systems Pertinent items noted in HPI and remainder of comprehensive ROS otherwise negative.   Objective:    BP 119/85   Pulse 76   Ht '5\' 4"'$  (1.626 m)   Wt 215 lb (97.5 kg)   LMP 01/13/2022 (Approximate)   Breastfeeding No   BMI 36.90 kg/m  General appearance: alert, cooperative, and appears stated age Head: Normocephalic, without obvious abnormality, atraumatic Neck: no adenopathy, supple, symmetrical, trachea midline, and thyroid not enlarged, symmetric, no tenderness/mass/nodules Lungs: clear to auscultation bilaterally Breasts: normal appearance, no masses or tenderness Heart: regular rate and rhythm, S1, S2 normal, no murmur, click, rub or gallop Abdomen: soft, non-tender; bowel sounds normal; no masses,  no organomegaly Extremities: extremities normal, atraumatic, no cyanosis or edema Pulses: 2+ and symmetric Skin: Skin color, texture, turgor normal. No rashes or lesions Lymph nodes: Cervical, supraclavicular, and axillary nodes normal. Neurologic: Grossly normal    Assessment:    Healthy female exam.      Plan:  Encounter for gynecological examination without abnormal finding - annual blood work. - Plan: CBC, TSH, Hemoglobin A1c, Comprehensive metabolic panel, Lipid panel  Return in 1 year (on 02/04/2023).    See After Visit Summary for Counseling Recommendations

## 2022-02-03 NOTE — Patient Instructions (Signed)

## 2022-02-03 NOTE — Progress Notes (Signed)
Patient presents for AEX.  Last Pap: 02/02/21- normal, next due 3-5 years  Last Mammogram: 01/18/22- normal  Contraception: none  Vaginal/Urinary Symptoms: none  STD Screen: Declines

## 2022-02-04 LAB — CBC
Hematocrit: 40.1 % (ref 34.0–46.6)
Hemoglobin: 13.3 g/dL (ref 11.1–15.9)
MCH: 29.9 pg (ref 26.6–33.0)
MCHC: 33.2 g/dL (ref 31.5–35.7)
MCV: 90 fL (ref 79–97)
Platelets: 280 10*3/uL (ref 150–450)
RBC: 4.45 x10E6/uL (ref 3.77–5.28)
RDW: 11.8 % (ref 11.7–15.4)
WBC: 5.3 10*3/uL (ref 3.4–10.8)

## 2022-02-04 LAB — LIPID PANEL
Chol/HDL Ratio: 3.7 ratio (ref 0.0–4.4)
Cholesterol, Total: 189 mg/dL (ref 100–199)
HDL: 51 mg/dL (ref >39)
LDL Chol Calc (NIH): 122 mg/dL — ABNORMAL HIGH (ref 0–99)
Triglycerides: 89 mg/dL (ref 0–149)
VLDL Cholesterol Cal: 16 mg/dL (ref 5–40)

## 2022-02-04 LAB — COMPREHENSIVE METABOLIC PANEL
ALT: 13 IU/L (ref 0–32)
AST: 23 IU/L (ref 0–40)
Albumin/Globulin Ratio: 1.9 (ref 1.2–2.2)
Albumin: 4.4 g/dL (ref 3.9–4.9)
Alkaline Phosphatase: 87 IU/L (ref 44–121)
BUN/Creatinine Ratio: 14 (ref 9–23)
BUN: 10 mg/dL (ref 6–24)
Bilirubin Total: 0.6 mg/dL (ref 0.0–1.2)
CO2: 21 mmol/L (ref 20–29)
Calcium: 9.1 mg/dL (ref 8.7–10.2)
Chloride: 101 mmol/L (ref 96–106)
Creatinine, Ser: 0.72 mg/dL (ref 0.57–1.00)
Globulin, Total: 2.3 g/dL (ref 1.5–4.5)
Glucose: 95 mg/dL (ref 70–99)
Potassium: 4.1 mmol/L (ref 3.5–5.2)
Sodium: 139 mmol/L (ref 134–144)
Total Protein: 6.7 g/dL (ref 6.0–8.5)
eGFR: 108 mL/min/{1.73_m2} (ref >59)

## 2022-02-04 LAB — HEMOGLOBIN A1C
Est. average glucose Bld gHb Est-mCnc: 111 mg/dL
Hgb A1c MFr Bld: 5.5 % (ref 4.8–5.6)

## 2022-02-04 LAB — TSH: TSH: 1.84 u[IU]/mL (ref 0.450–4.500)

## 2022-03-25 ENCOUNTER — Ambulatory Visit: Payer: BC Managed Care – PPO | Admitting: Obstetrics and Gynecology

## 2022-12-06 ENCOUNTER — Other Ambulatory Visit: Payer: Self-pay | Admitting: Family Medicine

## 2022-12-06 DIAGNOSIS — Z1231 Encounter for screening mammogram for malignant neoplasm of breast: Secondary | ICD-10-CM

## 2023-01-20 ENCOUNTER — Ambulatory Visit
Admission: RE | Admit: 2023-01-20 | Discharge: 2023-01-20 | Disposition: A | Discharge status: Home / Self Care | Payer: BC Managed Care – PPO | Source: Ambulatory Visit | Attending: Family Medicine | Admitting: Family Medicine

## 2023-01-20 DIAGNOSIS — Z1231 Encounter for screening mammogram for malignant neoplasm of breast: Secondary | ICD-10-CM | POA: Insufficient documentation

## 2023-12-05 ENCOUNTER — Other Ambulatory Visit: Payer: Self-pay | Admitting: Family Medicine

## 2023-12-05 DIAGNOSIS — Z1231 Encounter for screening mammogram for malignant neoplasm of breast: Secondary | ICD-10-CM

## 2024-01-23 ENCOUNTER — Ambulatory Visit
Admission: RE | Admit: 2024-01-23 | Discharge: 2024-01-23 | Disposition: A | Discharge status: Home / Self Care | Payer: Self-pay | Source: Ambulatory Visit | Attending: Family Medicine | Admitting: Family Medicine

## 2024-01-23 DIAGNOSIS — Z1231 Encounter for screening mammogram for malignant neoplasm of breast: Secondary | ICD-10-CM | POA: Diagnosis present

## 2024-01-25 ENCOUNTER — Ambulatory Visit: Payer: Self-pay | Admitting: Family Medicine
